# Patient Record
Sex: Female | Born: 1935 | Race: White | Hispanic: No | Smoking: Never smoker
Health system: Southern US, Community
[De-identification: ages and names within clinical notes are randomized; demographics above are authoritative.]

## PROBLEM LIST (undated history)

## (undated) DIAGNOSIS — G4700 Insomnia, unspecified: Secondary | ICD-10-CM

## (undated) DIAGNOSIS — E079 Disorder of thyroid, unspecified: Secondary | ICD-10-CM

## (undated) DIAGNOSIS — IMO0002 Reserved for concepts with insufficient information to code with codable children: Secondary | ICD-10-CM

## (undated) DIAGNOSIS — I482 Chronic atrial fibrillation, unspecified: Secondary | ICD-10-CM

## (undated) DIAGNOSIS — G459 Transient cerebral ischemic attack, unspecified: Secondary | ICD-10-CM

## (undated) DIAGNOSIS — I071 Rheumatic tricuspid insufficiency: Secondary | ICD-10-CM

## (undated) DIAGNOSIS — I340 Nonrheumatic mitral (valve) insufficiency: Secondary | ICD-10-CM

## (undated) DIAGNOSIS — I10 Essential (primary) hypertension: Secondary | ICD-10-CM

## (undated) DIAGNOSIS — G629 Polyneuropathy, unspecified: Secondary | ICD-10-CM

## (undated) DIAGNOSIS — M4800 Spinal stenosis, site unspecified: Secondary | ICD-10-CM

## (undated) DIAGNOSIS — E782 Mixed hyperlipidemia: Secondary | ICD-10-CM

## (undated) DIAGNOSIS — I214 Non-ST elevation (NSTEMI) myocardial infarction: Secondary | ICD-10-CM

## (undated) HISTORY — DX: Polyneuropathy, unspecified: G62.9

## (undated) HISTORY — DX: Reserved for concepts with insufficient information to code with codable children: IMO0002

## (undated) HISTORY — PX: REPLACEMENT TOTAL KNEE BILATERAL: SUR1225

## (undated) HISTORY — DX: Spinal stenosis, site unspecified: M48.00

## (undated) HISTORY — DX: Insomnia, unspecified: G47.00

## (undated) HISTORY — DX: Nonrheumatic mitral (valve) insufficiency: I34.0

## (undated) HISTORY — DX: Essential (primary) hypertension: I10

## (undated) HISTORY — DX: Chronic atrial fibrillation, unspecified: I48.20

## (undated) HISTORY — PX: TONSILLECTOMY: SUR1361

## (undated) HISTORY — DX: Non-ST elevation (NSTEMI) myocardial infarction: I21.4

## (undated) HISTORY — DX: Transient cerebral ischemic attack, unspecified: G45.9

## (undated) HISTORY — PX: DILATION AND CURETTAGE OF UTERUS: SHX78

## (undated) HISTORY — DX: Mixed hyperlipidemia: E78.2

## (undated) HISTORY — DX: Rheumatic tricuspid insufficiency: I07.1

---

## 2006-05-03 ENCOUNTER — Encounter: Payer: Self-pay | Admitting: Cardiology

## 2006-11-29 ENCOUNTER — Encounter: Payer: Self-pay | Admitting: Cardiology

## 2007-12-11 ENCOUNTER — Encounter: Payer: Self-pay | Admitting: Cardiology

## 2009-03-28 ENCOUNTER — Ambulatory Visit: Payer: Self-pay | Admitting: Cardiology

## 2009-03-28 DIAGNOSIS — I4891 Unspecified atrial fibrillation: Secondary | ICD-10-CM | POA: Insufficient documentation

## 2009-03-28 DIAGNOSIS — E785 Hyperlipidemia, unspecified: Secondary | ICD-10-CM

## 2009-03-28 DIAGNOSIS — Z862 Personal history of diseases of the blood and blood-forming organs and certain disorders involving the immune mechanism: Secondary | ICD-10-CM

## 2009-03-28 DIAGNOSIS — I10 Essential (primary) hypertension: Secondary | ICD-10-CM | POA: Insufficient documentation

## 2009-04-09 ENCOUNTER — Encounter: Payer: Self-pay | Admitting: Cardiology

## 2010-03-03 NOTE — Medication Information (Signed)
Summary: RX Folder/ MEDICATION LIST  RX Folder/ MEDICATION LIST   Imported By: Dorise Hiss 04/01/2009 15:23:09  _____________________________________________________________________  External Attachment:    Type:   Image     Comment:   External Document

## 2010-03-03 NOTE — Assessment & Plan Note (Signed)
Summary: NP-WANTING TO MOVE CLOSER TO HOME   Visit Type:  Initial Consult Primary Provider:  Dr. Rosealee Albee MD([place])  CC:  manage a-fib.  History of Present Illness: the patient is in soon-to-be 77 year old female with a history of chronic atrial fibrillation. The patient has declined Coumadin in the past. She is to be followed up for psych hospital. However given her advanced age she does not want to drive out of town anymore. The patient is doing remarkably well for her age. She denies any chest pain shortness of breath orthopnea PND. Her atrial fibrillation is rate controlled. She had an echocardiogram done 2 years ago which were normal LV function. The patient reports no heart failure symptoms. She has occasional palpitations typically in the evening will last a few minutes. She also has history of spinal stenosis with associated neuropathy but no chronic back pain. Due to this condition she often is off-balance.   Preventive Screening-Counseling & Management  Alcohol-Tobacco     Smoking Status: never  Current Medications (verified): 1)  Aspir-Trin 325 Mg Tbec (Aspirin) .... Take 1 Tablet By Mouth Once A Day 2)  Clindamycin Hcl 150 Mg Caps (Clindamycin Hcl) .... As Needed Dental Prophylaxix 3)  Atenolol 25 Mg Tabs (Atenolol) .... Take 1 Tablet By Mouth Two Times A Day 4)  Simvastatin 5 Mg Tabs (Simvastatin) .... Take 1 Tablet By Mouth Once A Day 5)  Levothroid 150 Mcg Tabs (Levothyroxine Sodium) .... Take 1 Tablet By Mouth Once A Day 6)  Lisinopril 5 Mg Tabs (Lisinopril) .... Take 1 Tablet By Mouth Once A Day  Allergies (verified): No Known Drug Allergies  Comments:  Nurse/Medical Assistant: The patient's medications and allergies were reviewed with the patient and were updated in the Medication and Allergy Lists. List reviewed.  Past History:  Family History: Last updated: 03/28/2009 noncontributory  Social History: Last updated: 03/28/2009 Retired  Tobacco Use -  No.   Risk Factors: Smoking Status: never (03/28/2009)  Past Medical History: Atrial Fibrillation Hypertension Hyperlipidemia Spinal stenosis and peripheral neuropathy. Normal LV function. Palpitations  Family History: Reviewed history and no changes required. noncontributory  Social History: Retired  Tobacco Use - No.  Smoking Status:  never  Review of Systems       The patient complains of dizziness.  The patient denies fatigue, malaise, fever, weight gain/loss, vision loss, decreased hearing, hoarseness, chest pain, palpitations, shortness of breath, prolonged cough, wheezing, sleep apnea, coughing up blood, abdominal pain, blood in stool, nausea, vomiting, diarrhea, heartburn, incontinence, blood in urine, muscle weakness, joint pain, leg swelling, rash, skin lesions, headache, fainting, depression, anxiety, enlarged lymph nodes, easy bruising or bleeding, and environmental allergies.    Vital Signs:  Patient profile:   77 year old female Height:      65 inches Weight:      180 pounds Pulse rate:   75 / minute BP sitting:   134 / 83  (left arm) Cuff size:   regular  Vitals Entered By: Carlye Grippe (March 28, 2009 8:24 AM) CC: manage a-fib   Physical Exam  Additional Exam:  General: Well-developed, well-nourished in no distress head: Normocephalic and atraumatic eyes PERRLA/EOMI intact, conjunctiva and lids normal nose: No deformity or lesions mouth normal dentition, normal posterior pharynx neck: Supple, no JVD.  No masses, thyromegaly or abnormal cervical nodes lungs: Normal breath sounds bilaterally without wheezing.  Normal percussion heart:ir regular rate and rhythm with normal S1 and S2, no S3 or S4.  PMI is normal.  No pathological murmurs abdomen: Normal bowel sounds, abdomen is soft and nontender without masses, organomegaly or hernias noted.  No hepatosplenomegaly musculoskeletal: Back normal, normal gait muscle strength and tone normal pulsus:  Pulse is normal in all 4 extremities Extremities: No peripheral pitting edema neurologic: Alert and oriented x 3 skin: Intact without lesions or rashes cervical nodes: No significant adenopathy psychologic: Normal affect    EKG  Procedure date:  03/28/2009  Findings:      atrial fibrillation with low-voltage QRS. Heart rate 88 beats per minutes QTC is 442 ms.  Impression & Recommendations:  Problem # 1:  ATRIAL FIBRILLATION (ICD-427.31) the patient is in chronic atrial fibrillation. Her heart rate is well controlled. There is no need for advancing her medications. We will continue her rate control strategy. As to the issue of Coumadin the patient has declined addressing this is a very appropriate decision at age 39 particularly with her history of spinal stenosis, balance problems and neuropathy. There is no clear benefit versus risk in using Coumadin for prevention of stroke in this situation. Her updated medication list for this problem includes:    Aspir-trin 325 Mg Tbec (Aspirin) .Marland Kitchen... Take 1 tablet by mouth once a day    Atenolol 25 Mg Tabs (Atenolol) .Marland Kitchen... Take 1 tablet by mouth two times a day  Orders: EKG w/ Interpretation (93000)  Problem # 2:  LIPID DISORDER, HX OF (ICD-V12.3) lipid panel is followed by her primary care physician. The patient is on cholesterol-lowering medications.  Problem # 3:  ESSENTIAL HYPERTENSION, BENIGN (ICD-401.1) blood pressure is well controlled. There is no further need of up titration of her medications. Her updated medication list for this problem includes:    Aspir-trin 325 Mg Tbec (Aspirin) .Marland Kitchen... Take 1 tablet by mouth once a day    Atenolol 25 Mg Tabs (Atenolol) .Marland Kitchen... Take 1 tablet by mouth two times a day    Lisinopril 5 Mg Tabs (Lisinopril) .Marland Kitchen... Take 1 tablet by mouth once a day  Patient Instructions: 1)  Take Vitamin D3 800 International Units once daily. 2)  Take Calcium/magnesium combination pill, if willing. 3)  Your physician  wants you to follow-up in: 1 year. You will receive a reminder letter in the mail one-two months in advance. If you don't receive a letter, please call our office to schedule the follow-up appointment.  Appended Document: NP-WANTING TO MOVE CLOSER TO HOME faxed to Dr. Rosealee Albee [phone].

## 2010-03-03 NOTE — Letter (Signed)
Summary: External Correspondence/ OFFICE VISITS [facility]   External Correspondence/ OFFICE VISITS [facility]   Imported By: Dorise Hiss 03/27/2009 16:17:33  _____________________________________________________________________  External Attachment:    Type:   Image     Comment:   External Document

## 2010-04-16 ENCOUNTER — Ambulatory Visit: Payer: Self-pay | Admitting: Cardiology

## 2010-05-05 ENCOUNTER — Encounter: Payer: Self-pay | Admitting: Cardiology

## 2010-05-05 ENCOUNTER — Ambulatory Visit (INDEPENDENT_AMBULATORY_CARE_PROVIDER_SITE_OTHER): Payer: Medicare Other | Admitting: Cardiology

## 2010-05-05 DIAGNOSIS — I4891 Unspecified atrial fibrillation: Secondary | ICD-10-CM

## 2010-05-05 DIAGNOSIS — I10 Essential (primary) hypertension: Secondary | ICD-10-CM

## 2010-05-05 DIAGNOSIS — I472 Ventricular tachycardia: Secondary | ICD-10-CM

## 2010-05-05 DIAGNOSIS — I4821 Permanent atrial fibrillation: Secondary | ICD-10-CM

## 2010-05-05 DIAGNOSIS — E785 Hyperlipidemia, unspecified: Secondary | ICD-10-CM

## 2010-05-05 NOTE — Patient Instructions (Signed)
No medicine changes. Follow up with [facility] in 6 months. You will receive a reminder letter in the mail about 2 months in advance.

## 2010-05-18 NOTE — Assessment & Plan Note (Signed)
Essential hypertension: Lisinopril was discontinued. We rechecked her blood pressure and was within normal limits. We will not restart lisinopril.

## 2010-05-18 NOTE — Assessment & Plan Note (Signed)
Rate controlled continue current medical therapy

## 2010-05-18 NOTE — Assessment & Plan Note (Signed)
Continue medical treatment. Patient's LDL is 43. Just been taken off simvastatin.

## 2010-05-18 NOTE — Progress Notes (Signed)
HPI The patient is a 77 year old female with a history of chronic atrial fibrillation. The patient has declined Coumadin in the past. She was seen a year ago at which time she denied any chest pain or shortness of breath. Her atrial fibrillation was well-controlled. 2 years prior to that she had an echocardiogram done which showed normal LV function. The patient reported no heart failure symptoms. The patient is obese her spinal stenosis associated with neuropathy and chronic back pain The patient still drives her car. I reviewed her laboratory work from her primary care physician. Her LDL is 43 inches been taken off simvastatin which is very appropriate. Her creatinine and hemoglobin are within normal limits. The patient still goes her aerobics class although she has some difficulty with balance. She denies any chest pain shortness of breath orthopnea or PND. She has rare palpitations. The echocardiogram is reviewed and demonstrates atrial fibrillation which is rate controlled but no acute ischemic changes. Her blood pressure was initially elevated during the office visit but on recheck at the time of the park sure her blood pressure was 132/82  No Known Allergies  No current outpatient prescriptions on file prior to visit.    Past Medical History  Diagnosis Date  . Hypertension   . Atrial fibrillation   . Other and unspecified hyperlipidemia   . Nonspecific abnormal results of liver function study   . Palpitations   . Spinal stenosis   . Peripheral neuropathy     Past Surgical History  Procedure Date  . Tonsillectomy   . Dilation and curettage of uterus   . Replacement total knee bilateral     No family history on file.  History   Social History  . Marital Status: Widowed    Spouse Name: N/A    Number of Children: N/A  . Years of Education: N/A   Occupational History  . RETIRED    Social History Main Topics  . Smoking status: Never Smoker   . Smokeless tobacco: Not on  file  . Alcohol Use: No  . Drug Use: No  . Sexually Active: Not on file   Other Topics Concern  . Not on file   Social History Narrative  . No narrative on file   Review of systems:Pertinent positives as outlined above. The remainder of the 18  point review of systems is negative  PHYSICAL EXAM BP 132/82  Pulse 106  Ht 5\' 5"  (1.651 m)  Wt 184 lb (83.462 kg)  BMI 30.62 kg/m2 General: Well-developed, well-nourished in no distress Head: Normocephalic and atraumatic Eyes:PERRLA/EOMI intact, conjunctiva and lids normal Ears: No deformity or lesions Mouth:normal dentition, normal posterior pharynx Neck: Supple, no JVD.  No masses, thyromegaly or abnormal cervical nodes Lungs: Normal breath sounds bilaterally without wheezing.  Normal percussion Cardiac: regular rate and rhythm with normal S1 and S2, no S3 or S4.  PMI is normal.  No pathological murmurs Abdomen: Normal bowel sounds, abdomen is soft and nontender without masses, organomegaly or hernias noted.  No hepatosplenomegaly MSK: Back normal, normal gait muscle strength and tone normal Vascular: Pulse is normal in all 4 extremities Extremities: No peripheral pitting edema Neurologic: Alert and oriented x 3 Skin: Intact without lesions or rashes Lymphatics: No significant adenopathy Psychologic: Normal affect   EAV:[id number] fibrillation  ASSESSMENT AND PLAN

## 2010-11-20 ENCOUNTER — Ambulatory Visit (INDEPENDENT_AMBULATORY_CARE_PROVIDER_SITE_OTHER): Payer: Medicare Other | Admitting: Cardiology

## 2010-11-20 ENCOUNTER — Other Ambulatory Visit: Payer: Self-pay | Admitting: Cardiology

## 2010-11-20 ENCOUNTER — Encounter: Payer: Self-pay | Admitting: Cardiology

## 2010-11-20 VITALS — BP 144/78 | HR 90 | Ht 65.0 in | Wt 183.0 lb

## 2010-11-20 DIAGNOSIS — I4891 Unspecified atrial fibrillation: Secondary | ICD-10-CM

## 2010-11-20 DIAGNOSIS — R220 Localized swelling, mass and lump, head: Secondary | ICD-10-CM

## 2010-11-20 DIAGNOSIS — I10 Essential (primary) hypertension: Secondary | ICD-10-CM

## 2010-11-20 DIAGNOSIS — R221 Localized swelling, mass and lump, neck: Secondary | ICD-10-CM

## 2010-11-20 DIAGNOSIS — R52 Pain, unspecified: Secondary | ICD-10-CM

## 2010-11-20 NOTE — Assessment & Plan Note (Signed)
Blood pressure is well controlled. Continue medical therapy

## 2010-11-20 NOTE — Patient Instructions (Signed)
   CT scan of neck If the results of your test are normal or stable, you will receive a letter.  If they are abnormal, the nurse will contact you by phone. Your physician wants you to follow up in: 6 months.  You will receive a reminder letter in the mail one-two months in advance.  If you don't receive a letter, please call our office to schedule the follow up appointment

## 2010-11-20 NOTE — Assessment & Plan Note (Signed)
The patient has a hard palpable nodule in the right submandibular area and we will order a CT scan of the neck to further evaluate. The nodule does not appear to be located in the thyroid gland.

## 2010-11-20 NOTE — Progress Notes (Signed)
History of present illness:  The patient is a 77 year old female with history of chronic atrial fibrillation, normal LV function, spinal stenosis and chronic back pain. From a cardiac standpoint has been doing well. She reports no chest pain or shortness of breath orthopnea PND. She reports no palpitations or syncope. The patient remains very active, she lives by herself and still drives a car. She did state that she felt some swelling in her right neck area with some tenderness to palpation.   Allergies, family history and social history: As documented in chart and reviewed  Medications: Documented and reviewed in chart.  Past medical history: Reviewed and see problem list below   Review of systems: No nausea or vomiting. No fever or chills. No melena or hematochezia. No orthopnea PND. No palpitations or syncope  Physical examination : Vital signs documented below General: Well-nourished white female walking with a cane. HEENT: EOMI, PERRLA, small nodular palpable mass in the right submandibular area, somewhat tender to touch. No thyromegaly or no masses in the thyroid normal carotid upstroke no carotid bruits Lungs: Clear breath sounds bilaterally no wheezing Heart: Irregular rate and rhythm with normal S1-S2. No pathological murmurs Abdomen: Soft nontender no rebound or guarding good bowel sounds Extremity exam: No cyanosis clubbing or edema. Neurologic: Alert and oriented grossly nonfocal Psychiatric: Normal affect Vascular exam: Normal peripheral pulses bilaterally

## 2010-11-20 NOTE — Assessment & Plan Note (Signed)
Chronic atrial fibrillation with rate control. Patient continues to decline Coumadin and she remains on aspirin. She reports no TIAs or CVA

## 2010-11-24 ENCOUNTER — Other Ambulatory Visit: Payer: Self-pay | Admitting: Cardiology

## 2010-11-24 DIAGNOSIS — E782 Mixed hyperlipidemia: Secondary | ICD-10-CM

## 2010-11-24 DIAGNOSIS — I10 Essential (primary) hypertension: Secondary | ICD-10-CM

## 2010-11-24 DIAGNOSIS — R221 Localized swelling, mass and lump, neck: Secondary | ICD-10-CM

## 2010-11-24 DIAGNOSIS — R220 Localized swelling, mass and lump, head: Secondary | ICD-10-CM

## 2011-03-25 ENCOUNTER — Other Ambulatory Visit: Payer: Self-pay | Admitting: Internal Medicine

## 2011-05-17 ENCOUNTER — Telehealth: Payer: Self-pay | Admitting: *Deleted

## 2011-05-17 NOTE — Telephone Encounter (Signed)
Received call from daughter.  States mother had spell with her atrial fib yesterday morning.  After taking meds, felt weak the rest of the day.  Did have some SOB with episode.  Family tried to get her to go to ED, but patient declined as she was in [place] & did not want to be put in hospital away from home.  Son is on the way back home with her today & wanted her seen by some one when they get back this evening.  Advised daughter that GD is out of the office.  Also, advised her that if her rate is controlled, MD may not add anything new.  We know she has chronic atrial fib & has declined coumadin in the past.  If her rate is uncontrolled with SOB then she should be evaluated in the ED.  The office is not the place to treat this.  She does already have OV scheduled for 4/24 with [facility].  Daughter will advise son of above.

## 2011-05-26 ENCOUNTER — Ambulatory Visit (INDEPENDENT_AMBULATORY_CARE_PROVIDER_SITE_OTHER): Payer: Medicare Other | Admitting: Cardiology

## 2011-05-26 ENCOUNTER — Encounter: Payer: Self-pay | Admitting: Cardiology

## 2011-05-26 VITALS — BP 116/77 | HR 79 | Ht 65.0 in | Wt 182.0 lb

## 2011-05-26 DIAGNOSIS — I4891 Unspecified atrial fibrillation: Secondary | ICD-10-CM

## 2011-05-26 DIAGNOSIS — G4700 Insomnia, unspecified: Secondary | ICD-10-CM

## 2011-05-26 DIAGNOSIS — I482 Chronic atrial fibrillation, unspecified: Secondary | ICD-10-CM | POA: Insufficient documentation

## 2011-05-26 DIAGNOSIS — I10 Essential (primary) hypertension: Secondary | ICD-10-CM

## 2011-05-26 MED ORDER — TRAZODONE HCL 50 MG PO TABS: ORAL_TABLET | ORAL | Status: DC

## 2011-05-26 NOTE — Progress Notes (Signed)
[name], MD, [facility] Board Certified in Adult Cardiovascular Medicine,Internal Medicine and Critical Care Medicine    CC: followup patient with history of chronic atrial fibrillation  HPI:  Patient state that 2 weeks ago when she went to [place] and an episode where she felt particularly beaked and may have had some palpitations.  She felt slightly presyncopal but has no associated shortness of breath or chest pain.  Her family wanted to bring her to the emergency room but the patient had not taken her medications yet.  She states that after she took her atenolol within a few minutes her symptoms resolved.  I presume that she had an episode of tachycardia palpitations.  She reports however no recurrent episodes of presyncope or syncope.  Otherwise cardiovascular perspective she is doing well with no orthopnea or PND. She also reports significant insomnia, she states that she often cannot fall asleep and ruminates and stays up all night.  When this happens she feels very poorly the next morning.  She brought an old prescription with Xanax and asked me for a refill.  I explained to her that this was not a particularly good choice to address the problem of primary insomnia which may or may not be related to some degree of depression.  PMH: reviewed and listed in Problem List in Electronic Records (and see below) Past Medical History  Diagnosis Date  . Hypertension   . Chronic atrial fibrillation     normal LV systolic functionnumber patient declined Coumadin in the past  . Other and unspecified hyperlipidemia   . Nonspecific abnormal results of liver function study   . Palpitations   . Spinal stenosis   . Peripheral neuropathy   . Insomnia    Past Surgical History  Procedure Date  . Tonsillectomy   . Dilation and curettage of uterus   . Replacement total knee bilateral     Allergies/SH/FHX : available in Electronic Records for review  No Known Allergies History   Social  History  . Marital Status: Widowed    Spouse Name: N/A    Number of Children: N/A  . Years of Education: N/A   Occupational History  . RETIRED    Social History Main Topics  . Smoking status: Never Smoker   . Smokeless tobacco: Never Used  . Alcohol Use: No  . Drug Use: No  . Sexually Active: Not on file   Other Topics Concern  . Not on file   Social History Narrative  . No narrative on file   No family history on file.  Medications: Current Outpatient Prescriptions  Medication Sig Dispense Refill  . aspirin 325 MG tablet Take 325 mg by mouth daily.        Marland Kitchen atenolol (TENORMIN) 25 MG tablet Take 25 mg by mouth 2 (two) times daily.       . Calcium Carbonate-Vitamin D (CALTRATE 600+D) 600-400 MG-UNIT per tablet Take 1 tablet by mouth daily.        . clindamycin (CLEOCIN) 150 MG capsule Take 450 mg by mouth as directed.       Marland Kitchen levothyroxine (SYNTHROID, LEVOTHROID) 150 MCG tablet Take 150 mcg by mouth daily.        . traZODone (DESYREL) 50 MG tablet May take 1/2 tab (25mg ) nightly as needed for sleep, may increase to 1 tab (50mg ) if necessary  30 tablet  1    ROS: No nausea or vomiting. No fever or chills.No melena or hematochezia.No bleeding.No  claudication  Physical Exam: BP 116/77  Pulse 79  Ht 5\' 5"  (1.651 m)  Wt 182 lb (82.555 kg)  BMI 30.29 kg/m2  SpO2 96% General:well-nourished white female walking with a cane Neck:normal carotid upstroke and no carotid bruits.  No thyromegaly nonnodular thyroid.  JVP 6 cm Lungs:clear breath sounds bilaterally.  No wheezing Cardiac:irregular and rhythm with normal S1 and S2 and no murmur rubs or gallops Vascular:no edema.  Normal distal pulses Skin:warm and dry Physcologic:normal affect  12lead BJY:NWGNFA fibrillation with rate control Limited bedside ECHO:N/A No images are attached to the encounter.   Assessment and Plan  Chronic atrial fibrillation Patient has declined Coumadin in the past.  She also has difficulty  walking and would be at increased risk for falls.  There has been no history of stroke.  The episode she described probably was related to rapid atrial fibrillation and I told her to take when necessary and extra dose of atenolol if this happens again.  There was no presyncope or syncope associated with this.Will followup patient in 6 months.  Insomnia Patient asked for a refill on Xanax but I told her that given her age this is not a particularly good solution for insomnia.  She states that she has difficulty falling asleep and staying asleep.  Then she becomes anxious in the middle of the night.  I told her that we should address her right ovary insomnia problem first and have given her a prescription of trazodone 25 mg by mouth daily at bedtime, which can be increased to 50 min. By mouth daily at bedtime if needed  ESSENTIAL HYPERTENSION, BENIGN Blood pressure well-controlled no further change in medical therapy    Patient Active Problem List  Diagnoses  . HYPERLIPIDEMIA  . ESSENTIAL HYPERTENSION, BENIGN  . Neck nodule  . Chronic atrial fibrillation  . Insomnia

## 2011-05-26 NOTE — Assessment & Plan Note (Signed)
Blood pressure well-controlled no further change in medical therapy

## 2011-05-26 NOTE — Assessment & Plan Note (Addendum)
Patient has declined Coumadin in the past.  She also has difficulty walking and would be at increased risk for falls.  There has been no history of stroke.  The episode she described probably was related to rapid atrial fibrillation and I told her to take when necessary and extra dose of atenolol if this happens again.  There was no presyncope or syncope associated with this.Will followup patient in 6 months.

## 2011-05-26 NOTE — Patient Instructions (Addendum)
Continue all current medications. Trazodone 50mg  - may take 1/2 tab (25mg ) nightly as needed for sleep, may increase to 1 tab (50mg ) if necessary.  If this medication is helpful, follow up for maintenance with primary MD Your physician wants you to follow up in: 6 months.  You will receive a reminder letter in the mail one-two months in advance.  If you don't receive a letter, please call our office to schedule the follow up appointment

## 2011-05-26 NOTE — Assessment & Plan Note (Signed)
Patient asked for a refill on Xanax but I told her that given her age this is not a particularly good solution for insomnia.  She states that she has difficulty falling asleep and staying asleep.  Then she becomes anxious in the middle of the night.  I told her that we should address her right ovary insomnia problem first and have given her a prescription of trazodone 25 mg by mouth daily at bedtime, which can be increased to 50 min. By mouth daily at bedtime if needed

## 2011-06-03 ENCOUNTER — Other Ambulatory Visit: Payer: Self-pay | Admitting: Emergency Medicine

## 2011-06-04 ENCOUNTER — Other Ambulatory Visit: Payer: Self-pay

## 2011-06-04 DIAGNOSIS — I4891 Unspecified atrial fibrillation: Secondary | ICD-10-CM

## 2011-06-04 DIAGNOSIS — R748 Abnormal levels of other serum enzymes: Secondary | ICD-10-CM

## 2011-06-07 ENCOUNTER — Telehealth: Payer: Self-pay | Admitting: Cardiology

## 2011-06-07 NOTE — Telephone Encounter (Signed)
[name] called the office today in reference to his mother Brandy Webb. States that she was discharged from  [facility] on 06/05/2011. Mr. Flom was concerned about the results of his mother's Echo that was done at the Hospital. Also, does patient need to have an office follow up.

## 2011-06-13 NOTE — Telephone Encounter (Signed)
Needs f/u in 7- 10 days.

## 2011-06-14 NOTE — Telephone Encounter (Signed)
Forwarding to you to schedule follow up as requested below.

## 2011-06-16 ENCOUNTER — Ambulatory Visit (INDEPENDENT_AMBULATORY_CARE_PROVIDER_SITE_OTHER): Payer: Medicare Other | Admitting: Cardiology

## 2011-06-16 ENCOUNTER — Encounter: Payer: Self-pay | Admitting: Cardiology

## 2011-06-16 VITALS — BP 120/76 | HR 94 | Ht 65.0 in | Wt 185.0 lb

## 2011-06-16 DIAGNOSIS — Z9181 History of falling: Secondary | ICD-10-CM

## 2011-06-16 DIAGNOSIS — I059 Rheumatic mitral valve disease, unspecified: Secondary | ICD-10-CM

## 2011-06-16 DIAGNOSIS — G459 Transient cerebral ischemic attack, unspecified: Secondary | ICD-10-CM

## 2011-06-16 DIAGNOSIS — I482 Chronic atrial fibrillation, unspecified: Secondary | ICD-10-CM

## 2011-06-16 DIAGNOSIS — I4891 Unspecified atrial fibrillation: Secondary | ICD-10-CM

## 2011-06-16 DIAGNOSIS — I2789 Other specified pulmonary heart diseases: Secondary | ICD-10-CM

## 2011-06-16 DIAGNOSIS — I079 Rheumatic tricuspid valve disease, unspecified: Secondary | ICD-10-CM

## 2011-06-16 DIAGNOSIS — I272 Pulmonary hypertension, unspecified: Secondary | ICD-10-CM

## 2011-06-16 DIAGNOSIS — I214 Non-ST elevation (NSTEMI) myocardial infarction: Secondary | ICD-10-CM

## 2011-06-16 DIAGNOSIS — I340 Nonrheumatic mitral (valve) insufficiency: Secondary | ICD-10-CM

## 2011-06-16 DIAGNOSIS — G4700 Insomnia, unspecified: Secondary | ICD-10-CM

## 2011-06-16 DIAGNOSIS — I071 Rheumatic tricuspid insufficiency: Secondary | ICD-10-CM

## 2011-06-16 MED ORDER — RIVAROXABAN 15 MG PO TABS: 15.0000 mg | ORAL_TABLET | Freq: Every day | ORAL | Status: DC

## 2011-06-16 NOTE — Patient Instructions (Signed)
   Stop Aspirin  Begin Xarelto 15mg  daily - need to be off the Aspirin x 5 days prior to beginning new medication  No NSAID's - only Tylenol Follow up in  3 months

## 2011-07-10 DIAGNOSIS — Z9181 History of falling: Secondary | ICD-10-CM | POA: Insufficient documentation

## 2011-07-10 DIAGNOSIS — I071 Rheumatic tricuspid insufficiency: Secondary | ICD-10-CM | POA: Insufficient documentation

## 2011-07-10 DIAGNOSIS — I214 Non-ST elevation (NSTEMI) myocardial infarction: Secondary | ICD-10-CM | POA: Insufficient documentation

## 2011-07-10 DIAGNOSIS — I340 Nonrheumatic mitral (valve) insufficiency: Secondary | ICD-10-CM | POA: Insufficient documentation

## 2011-07-10 DIAGNOSIS — I272 Pulmonary hypertension, unspecified: Secondary | ICD-10-CM | POA: Insufficient documentation

## 2011-07-10 DIAGNOSIS — G459 Transient cerebral ischemic attack, unspecified: Secondary | ICD-10-CM | POA: Insufficient documentation

## 2011-07-10 NOTE — Assessment & Plan Note (Signed)
No recurrent chest pain and no prior history of coronary artery disease.  Continue current medical therapy

## 2011-07-10 NOTE — Assessment & Plan Note (Signed)
Patient has permanent atrial fibrillation.  She is at high risk for thromboembolic disease.  She presented recently with confusion and an episode of slurred speech and the possibility of TIA.chest consent to proceed with anticoagulation with Xarelto at 15 mg by mouth daily adjusted for her renal function.I discussed the risks and benefits of anticoagulation with the patient extensively.

## 2011-07-10 NOTE — Assessment & Plan Note (Signed)
Improved with trazodone  

## 2011-07-10 NOTE — Assessment & Plan Note (Signed)
Patient has not been started on anticoagulant therapy.  She will be followed closely.

## 2011-07-10 NOTE — Assessment & Plan Note (Signed)
Patient candidate for medical therapy only.

## 2011-07-10 NOTE — Assessment & Plan Note (Signed)
The patient does report a single episode of fall May 2.  She fell against the basement door.  There was no significant injury.  She will not be more supervised as she is moving into [employer]

## 2011-07-10 NOTE — Assessment & Plan Note (Signed)
Likely secondary to pulmonary venous hypertension and diastolic dysfunction.  Continue medical therapy and monitor oxygenation.  Consider oxygen therapy in the future if needed for desaturation

## 2011-07-10 NOTE — Progress Notes (Signed)
Brandy Bottoms, MD, [facility] Board Certified in Adult Cardiovascular Medicine,Internal Medicine and Critical Care Medicine    CC: followup patient with recent non-ST elevation microinfarction  HPI:  The patient was recently hospitalized with a non-ST elevation myocardial infarction in the setting of urinary tract infection and rapid heart rates.  This was felt to be a type II myocardial infarction.  She was treated medically.  She is felt not to be a candidate for cardiac catheterization and has remained in chronic atrial fibrillation albeit with normal LV function.  The patient has a significant atriopathy with biatrial enlargement and significant .  She also has severe mitral and tricuspid valvular regurgitation.  She has been treated medically.  She has declined Coumadin in the past and had a recent TIA manifested by slurred speech as well as prior episodes of confusion which was felt to be a neurological event.  The patient remains at high risk for trauma embolic disease. She reports shortness of breath on minimal exertion.  Currently she has no orthopnea or PND she does have rare palpitations.  She takes when necessary Tylenol if needed.  She also has a history of insomnia which appeared to be improved.She presents for followup after her recent hospitalization and discussed the need for possible anticoagulation   PMH: reviewed and listed in Problem List in Electronic Records (and see below) Past Medical History  Diagnosis Date  . Hypertension   . Chronic atrial fibrillation     normal LV systolic functionnumber patient declined Coumadin in the past  . Other and unspecified hyperlipidemia   . Nonspecific abnormal results of liver function study   . Palpitations   . Spinal stenosis   . Peripheral neuropathy   . Insomnia    Past Surgical History  Procedure Date  . Tonsillectomy   . Dilation and curettage of uterus   . Replacement total knee bilateral     Allergies/SH/FHX :  available in Electronic Records for review  No Known Allergies History   Social History  . Marital Status: Widowed    Spouse Name: N/A    Number of Children: N/A  . Years of Education: N/A   Occupational History  . RETIRED    Social History Main Topics  . Smoking status: Never Smoker   . Smokeless tobacco: Never Used  . Alcohol Use: No  . Drug Use: No  . Sexually Active: Not on file   Other Topics Concern  . Not on file   Social History Narrative  . No narrative on file   No family history on file.  Medications: Current Outpatient Prescriptions  Medication Sig Dispense Refill  . atenolol (TENORMIN) 25 MG tablet Take 25 mg by mouth 2 (two) times daily.       . Calcium Carbonate-Vitamin D (CALTRATE 600+D) 600-400 MG-UNIT per tablet Take 1 tablet by mouth daily.        . clindamycin (CLEOCIN) 150 MG capsule Take 450 mg by mouth as directed.       Marland Kitchen levothyroxine (SYNTHROID, LEVOTHROID) 150 MCG tablet Take 150 mcg by mouth daily.        . traZODone (DESYREL) 50 MG tablet May take 1/2 tab (25mg ) nightly as needed for sleep, may increase to 1 tab (50mg ) if necessary  30 tablet  1  . Rivaroxaban (XARELTO) 15 MG TABS tablet Take 1 tablet (15 mg total) by mouth daily.  30 tablet  6    ROS: No nausea or vomiting. No fever  or chills.No melena or hematochezia.No bleeding.No claudication  Physical Exam: BP 120/76  Pulse 94  Ht 5\' 5"  (1.651 m)  Wt 185 lb (83.915 kg)  BMI 30.79 kg/m2  SpO2 95% General:well-nourished white female in no distress Neck:normal carotid upstroke and no carotid bruits.  No thyromegaly no nodular thyroid.  JVP is 6-7 cm Lungs:clear breath sounds bilaterally without wheezing Cardiac:irregular rate and rhythm with normal S1 and S2 no definite S3.  2/6 holosystolic murmur at the left lower sternal border and a 3/6 holosystolic murmur at the apex Vascular:no edema.  Normal distal pulses Skin:.warm and dry Physcologic:normal affect  12lead ECG:not  obtained Limited bedside ECHO:N/A No images are attached to the encounter.   Assessment and Plan  Permanent atrial fibrillation Patient has permanent atrial fibrillation.  She is at high risk for thromboembolic disease.  She presented recently with confusion and an episode of slurred speech and the possibility of TIA.chest consent to proceed with anticoagulation with Xarelto at 15 mg by mouth daily adjusted for her renal function.I discussed the risks and benefits of anticoagulation with the patient extensively.  Insomnia Improved with trazodone.  Non-ST elevation myocardial infarction (NSTEMI) No recurrent chest pain and no prior history of coronary artery disease.  Continue current medical therapy  Pulmonary hypertension Likely secondary to pulmonary venous hypertension and diastolic dysfunction.  Continue medical therapy and monitor oxygenation.  Consider oxygen therapy in the future if needed for desaturation  Falls infrequently The patient does report a single episode of fall May 2.  She fell against the basement door.  There was no significant injury.  She will not be more supervised as she is moving into [employer]  TIA (transient ischemic attack) Patient has not been started on anticoagulant therapy.  She will be followed closely.  Mitral regurgitation Patient candidate for medical therapy only.    Patient Active Problem List  Diagnoses  . HYPERLIPIDEMIA  . ESSENTIAL HYPERTENSION, BENIGN  . Neck nodule  . Chronic atrial fibrillation  . Insomnia  . Non-ST elevation myocardial infarction (NSTEMI)  . Pulmonary hypertension  . Falls infrequently  . TIA (transient ischemic attack)  . Tricuspid regurgitation  . Mitral regurgitation

## 2011-09-20 ENCOUNTER — Encounter: Payer: Self-pay | Admitting: Cardiology

## 2011-09-20 ENCOUNTER — Ambulatory Visit (INDEPENDENT_AMBULATORY_CARE_PROVIDER_SITE_OTHER): Payer: Medicare Other | Admitting: Cardiology

## 2011-09-20 VITALS — BP 114/84 | HR 81 | Ht 65.0 in | Wt 186.8 lb

## 2011-09-20 DIAGNOSIS — I214 Non-ST elevation (NSTEMI) myocardial infarction: Secondary | ICD-10-CM

## 2011-09-20 DIAGNOSIS — I071 Rheumatic tricuspid insufficiency: Secondary | ICD-10-CM

## 2011-09-20 DIAGNOSIS — Z9181 History of falling: Secondary | ICD-10-CM

## 2011-09-20 DIAGNOSIS — G4700 Insomnia, unspecified: Secondary | ICD-10-CM

## 2011-09-20 DIAGNOSIS — I10 Essential (primary) hypertension: Secondary | ICD-10-CM

## 2011-09-20 DIAGNOSIS — I340 Nonrheumatic mitral (valve) insufficiency: Secondary | ICD-10-CM

## 2011-09-20 DIAGNOSIS — I059 Rheumatic mitral valve disease, unspecified: Secondary | ICD-10-CM

## 2011-09-20 DIAGNOSIS — I079 Rheumatic tricuspid valve disease, unspecified: Secondary | ICD-10-CM

## 2011-09-20 DIAGNOSIS — I482 Chronic atrial fibrillation, unspecified: Secondary | ICD-10-CM

## 2011-09-20 DIAGNOSIS — I4891 Unspecified atrial fibrillation: Secondary | ICD-10-CM

## 2011-09-20 MED ORDER — ALBUTEROL SULFATE HFA 108 (90 BASE) MCG/ACT IN AERS: 2.0000 | INHALATION_SPRAY | Freq: Four times a day (QID) | RESPIRATORY_TRACT | Status: DC | PRN

## 2011-09-20 NOTE — Patient Instructions (Addendum)
Continue all current medications. Your physician wants you to follow up in: 6 months.  You will receive a reminder letter in the mail one-two months in advance.  If you don't receive a letter, please call our office to schedule the follow up appointment   

## 2011-09-20 NOTE — Assessment & Plan Note (Signed)
Blood pressure well controlled

## 2011-09-20 NOTE — Assessment & Plan Note (Signed)
No chest pain. No ischemia workup needed

## 2011-09-20 NOTE — Assessment & Plan Note (Signed)
Patient takes occasional trazodone which helps

## 2011-09-20 NOTE — Assessment & Plan Note (Signed)
No further therapy indicated patient is hemodynamically stable

## 2011-09-20 NOTE — Assessment & Plan Note (Addendum)
No significant dyspnea or volume overload with rest. Continue current medical regimen. Patient does have severe tricuspid regurgitation but has minimal lower extremity edema which is in part due also to chronic venous insufficiency. Heart rate well controlled

## 2011-09-20 NOTE — Assessment & Plan Note (Signed)
Medical therapy as outlined above.

## 2011-09-20 NOTE — Assessment & Plan Note (Signed)
Patient is doing well on low-dose Xarelto. Although she has some unsteadiness she reports no falls and is being very careful and use a walker if needed.

## 2011-09-20 NOTE — Progress Notes (Signed)
Brandy Bottoms, MD, [facility] Board Certified in Adult Cardiovascular Medicine,Internal Medicine and Critical Care Medicine    CC:      Chronic atrial fibrillation                                                                            HPI:        Patient has history of chronic atrial fibrillation and severe tricuspid regurgitation. However she is hemodynamically stable and reports no significant shortness of breath at rest. She also reports no recurrent palpitations. She is now moving into independent living and is doing quite well. She has been started on Xarelto in the setting of atrial fibrillation and high risk for stroke despite her advanced age she reports no falls and she seemed to be tolerating the medication well. The patient reports no orthopnea PND palpitations or syncope   PMH: reviewed and listed in Problem List in Electronic Records (and see below) Past Medical History  Diagnosis Date  . Hypertension   . Chronic atrial fibrillation     normal LV systolic functionnumber patient declined Coumadin in the past  . Other and unspecified hyperlipidemia   . Nonspecific abnormal results of liver function study   . Palpitations   . Spinal stenosis   . Peripheral neuropathy   . Insomnia    Past Surgical History  Procedure Date  . Tonsillectomy   . Dilation and curettage of uterus   . Replacement total knee bilateral     Allergies/SH/FHX : available in Electronic Records for review  No Known Allergies History   Social History  . Marital Status: Widowed    Spouse Name: N/A    Number of Children: N/A  . Years of Education: N/A   Occupational History  . RETIRED    Social History Main Topics  . Smoking status: Never Smoker   . Smokeless tobacco: Never Used  . Alcohol Use: No  . Drug Use: No  . Sexually Active: Not on file   Other Topics Concern  . Not on file   Social History Narrative  . No narrative on file   No family history on  file.  Medications: Current Outpatient Prescriptions  Medication Sig Dispense Refill  . atenolol (TENORMIN) 25 MG tablet Take 25 mg by mouth 2 (two) times daily.       Marland Kitchen levothyroxine (SYNTHROID, LEVOTHROID) 150 MCG tablet Take 150 mcg by mouth daily.        . Rivaroxaban (XARELTO) 15 MG TABS tablet Take 1 tablet (15 mg total) by mouth daily.  30 tablet  6  . traZODone (DESYREL) 50 MG tablet May take 1/2 tab (25mg ) nightly as needed for sleep, may increase to 1 tab (50mg ) if necessary  30 tablet  1  . albuterol (PROVENTIL HFA;VENTOLIN HFA) 108 (90 BASE) MCG/ACT inhaler Inhale 2 puffs into the lungs every 6 (six) hours as needed for wheezing.  1 Inhaler  2  . clindamycin (CLEOCIN) 150 MG capsule Take 450 mg by mouth as directed.         ROS: No nausea or vomiting. No fever or chills.No melena or hematochezia.No bleeding.No claudication  Physical Exam: BP 114/84  Pulse 81  Ht 5\' 5"  (1.651 m)  Wt 186 lb 12.8 oz (84.732 kg)  BMI 31.09 kg/m2  SpO2 97% General: Well-nourished white female in no distress. Neck: Normal carotid upstroke no carotid bruits. No thyromegaly nonnodular thyroid. JVP 7 cm Lungs: Clear breath sounds bilaterally without any crackles or wheezing Cardiac: Irregular rate and rhythm with normal S1 and prominent S2. 2/6 holosystolic murmur left sternal border Vascular: No edema. Normal distal pulses Skin: Warm and dry. Significant varicosities Physcologic: Normal affect  12lead ECG: Not obtained Limited bedside ECHO:N/A No images are attached to the encounter.   I reviewed and summarized the old records. I reviewed ECG and prior blood work.  Assessment and Plan  Chronic atrial fibrillation No significant dyspnea or volume overload with rest. Continue current medical regimen. Patient does have severe tricuspid regurgitation but has minimal lower extremity edema which is in part due also to chronic venous insufficiency. Heart rate well controlled  ESSENTIAL  HYPERTENSION, BENIGN Blood pressure well controlled  Falls infrequently Patient is doing well on low-dose Xarelto. Although she has some unsteadiness she reports no falls and is being very careful and use a walker if needed.  Insomnia Patient takes occasional trazodone which helps  Mitral regurgitation No further therapy indicated patient is hemodynamically stable  Non-ST elevation myocardial infarction (NSTEMI) No chest pain. No ischemia workup needed  Tricuspid regurgitation Medical therapy as outlined above.    Patient Active Problem List  Diagnosis  . HYPERLIPIDEMIA  . ESSENTIAL HYPERTENSION, BENIGN  . Neck nodule  . Chronic atrial fibrillation  . Insomnia  . Non-ST elevation myocardial infarction (NSTEMI)  . Pulmonary hypertension  . Falls infrequently  . TIA (transient ischemic attack)  . Tricuspid regurgitation

## 2011-09-21 ENCOUNTER — Other Ambulatory Visit: Payer: Self-pay | Admitting: *Deleted

## 2011-11-08 ENCOUNTER — Ambulatory Visit: Payer: Medicare Other | Admitting: Cardiology

## 2012-01-07 ENCOUNTER — Other Ambulatory Visit: Payer: Self-pay | Admitting: Cardiology

## 2012-01-07 ENCOUNTER — Other Ambulatory Visit: Payer: Self-pay | Admitting: *Deleted

## 2012-01-07 ENCOUNTER — Telehealth: Payer: Self-pay | Admitting: Cardiology

## 2012-01-07 MED ORDER — RIVAROXABAN 15 MG PO TABS: 15.0000 mg | ORAL_TABLET | Freq: Every day | ORAL | Status: DC

## 2012-02-01 DIAGNOSIS — I4891 Unspecified atrial fibrillation: Secondary | ICD-10-CM

## 2012-02-08 ENCOUNTER — Encounter: Payer: Self-pay | Admitting: Cardiology

## 2012-02-08 DIAGNOSIS — I340 Nonrheumatic mitral (valve) insufficiency: Secondary | ICD-10-CM | POA: Insufficient documentation

## 2012-02-10 ENCOUNTER — Ambulatory Visit (INDEPENDENT_AMBULATORY_CARE_PROVIDER_SITE_OTHER): Payer: Medicare Other | Admitting: Cardiology

## 2012-02-10 ENCOUNTER — Encounter: Payer: Self-pay | Admitting: Cardiology

## 2012-02-10 VITALS — BP 127/78 | HR 106 | Ht 65.0 in | Wt 181.0 lb

## 2012-02-10 DIAGNOSIS — I059 Rheumatic mitral valve disease, unspecified: Secondary | ICD-10-CM

## 2012-02-10 DIAGNOSIS — I071 Rheumatic tricuspid insufficiency: Secondary | ICD-10-CM

## 2012-02-10 DIAGNOSIS — I10 Essential (primary) hypertension: Secondary | ICD-10-CM

## 2012-02-10 DIAGNOSIS — I482 Chronic atrial fibrillation, unspecified: Secondary | ICD-10-CM

## 2012-02-10 DIAGNOSIS — I340 Nonrheumatic mitral (valve) insufficiency: Secondary | ICD-10-CM

## 2012-02-10 DIAGNOSIS — I4891 Unspecified atrial fibrillation: Secondary | ICD-10-CM

## 2012-02-10 DIAGNOSIS — I079 Rheumatic tricuspid valve disease, unspecified: Secondary | ICD-10-CM

## 2012-02-10 NOTE — Progress Notes (Signed)
Clinical Summary Brandy Webb is a 79 y.o.female presenting for followup. She is a former patient of Dr. Andee Lineman, prefers to stay with [facility]. She was last seen in August 2013.  Recent record review finds hospitalization in late December with progressive cough and evidence of pneumonia. She was treated with antibiotics, also seen by our service in consultation. Atenolol was increased to 50 lm in the morning and 25 mg in the evening for better heart rate control, although she was not discharged home on this regimen. She is here with her son today, states that she is still weak but gradually starting to feel better. She does have home health services.  She reports no bleeding problems on Xarelto. Recent lab work showed BUN 15, creatinine 0.7, potassium 3.9, hemoglobin 12.9.  She has been managed conservatively with significant valvular heart disease. I reviewed this with them today. She does not want any invasive evaluations.  No Known Allergies  Current Outpatient Prescriptions  Medication Sig Dispense Refill  . atenolol (TENORMIN) 25 MG tablet Take 25 mg by mouth 2 (two) times daily.       . cefUROXime (CEFTIN) 500 MG tablet Take 500 mg by mouth 2 (two) times daily.      . clindamycin (CLEOCIN) 150 MG capsule Take 450 mg by mouth as directed.       . doxycycline (VIBRA-TABS) 100 MG tablet Take 100 mg by mouth 2 (two) times daily.      . furosemide (LASIX) 20 MG tablet Take 20 mg by mouth daily.      Marland Kitchen levothyroxine (SYNTHROID, LEVOTHROID) 150 MCG tablet Take 150 mcg by mouth daily.        . Rivaroxaban (XARELTO) 15 MG TABS tablet Take 1 tablet (15 mg total) by mouth daily.  30 tablet  6  . traZODone (DESYREL) 50 MG tablet May take 1/2 tab (25mg ) nightly as needed for sleep, may increase to 1 tab (50mg ) if necessary  30 tablet  1    Past Medical History  Diagnosis Date  . Essential hypertension, benign   . Chronic atrial fibrillation   . Mixed hyperlipidemia   . Spinal stenosis   .  Peripheral neuropathy   . Insomnia   . Mitral regurgitation     Moderate to severe  . Tricuspid regurgitation     Severe  . Secondary pulmonary hypertension     Moderate  . Non-ST elevation MI (NSTEMI)     May 2013 - type 2 event managed medically  . TIA (transient ischemic attack)     Past Surgical History  Procedure Date  . Tonsillectomy   . Dilation and curettage of uterus   . Replacement total knee bilateral     Social History Brandy Webb reports that she has never smoked. She has never used smokeless tobacco. Brandy Webb reports that she does not drink alcohol.  Review of Systems Uses a walker, denies any falls. Has mild ankle edema. Anxious feeling at night time when she lies down. Otherwise negative.  Physical Examination Filed Vitals:   02/10/12 1442  BP: 127/78  Pulse: 106   Filed Weights   02/10/12 1442  Weight: 181 lb (82.101 kg)   No acute distress. HEENT: Conjunctiva and lids normal, oropharynx clear. Neck: Supple, no elevated JVP, no thyromegaly. Lungs: Diminished BS, clear to auscultation, nonlabored breathing at rest. Cardiac: Irregularly irregular, no S3, soft apical systolic murmur, no pericardial rub. Abdomen: Soft, nontender,bowel sounds present, no guarding or rebound. Extremities: Trace edema, distal  pulses 1+. Skin: Warm and dry. Musculoskeletal: Mild  kyphosis. Neuropsychiatric: Alert and oriented x3, affect grossly appropriate.   Problem List and Plan   Chronic atrial fibrillation For now will continue atenolol 25 mg twice daily, although request vital sign checks from home health for further review. May need to advance dose further for heart rate control. She continues on Xarelto, no major bleeding problems, stable renal function.  Mitral regurgitation Moderately severe, being managed conservatively. She is on low-dose Lasix. Daily weights being followed. Suspect some of her sense of anxiety when she lies down at night time may be  related to this.  Tricuspid regurgitation Severe, associated with pulmonary hypertension, being managed conservatively.  ESSENTIAL HYPERTENSION, BENIGN No change to current regimen.    Jonelle Sidle, M.D., F.A.C.C.

## 2012-02-10 NOTE — Assessment & Plan Note (Signed)
For now will continue atenolol 25 mg twice daily, although request vital sign checks from home health for further review. May need to advance dose further for heart rate control. She continues on Xarelto, no major bleeding problems, stable renal function.

## 2012-02-10 NOTE — Patient Instructions (Addendum)
Your physician recommends that you schedule a follow-up appointment in: 1 month. Your physician recommends that you continue on your current medications as directed. Please refer to the Current Medication list given to you today.  Please have your home health agency to fax our office 1 week set of vital signs including weights.

## 2012-02-10 NOTE — Assessment & Plan Note (Signed)
Moderately severe, being managed conservatively. She is on low-dose Lasix. Daily weights being followed. Suspect some of her sense of anxiety when she lies down at night time may be related to this.

## 2012-02-10 NOTE — Assessment & Plan Note (Signed)
Severe, associated with pulmonary hypertension, being managed conservatively.

## 2012-02-10 NOTE — Assessment & Plan Note (Signed)
No change to current regimen. 

## 2012-03-03 ENCOUNTER — Other Ambulatory Visit: Payer: Self-pay | Admitting: *Deleted

## 2012-03-03 MED ORDER — FUROSEMIDE 20 MG PO TABS: 20.0000 mg | ORAL_TABLET | Freq: Every day | ORAL | Status: DC

## 2012-03-17 ENCOUNTER — Ambulatory Visit: Payer: Medicare Other | Admitting: Cardiology

## 2012-04-14 ENCOUNTER — Encounter: Payer: Self-pay | Admitting: Cardiology

## 2012-04-14 ENCOUNTER — Ambulatory Visit (INDEPENDENT_AMBULATORY_CARE_PROVIDER_SITE_OTHER): Payer: Medicare Other | Admitting: Cardiology

## 2012-04-14 VITALS — BP 129/74 | HR 76 | Ht 65.0 in | Wt 185.0 lb

## 2012-04-14 DIAGNOSIS — I10 Essential (primary) hypertension: Secondary | ICD-10-CM

## 2012-04-14 DIAGNOSIS — I4891 Unspecified atrial fibrillation: Secondary | ICD-10-CM

## 2012-04-14 DIAGNOSIS — I482 Chronic atrial fibrillation, unspecified: Secondary | ICD-10-CM

## 2012-04-14 DIAGNOSIS — I059 Rheumatic mitral valve disease, unspecified: Secondary | ICD-10-CM

## 2012-04-14 DIAGNOSIS — I340 Nonrheumatic mitral (valve) insufficiency: Secondary | ICD-10-CM

## 2012-04-14 NOTE — Assessment & Plan Note (Signed)
Heart rate control looks good today. No changes in atenolol dosing. She continues on Xarelto without significant bleeding problems.

## 2012-04-14 NOTE — Progress Notes (Signed)
   Clinical Summary Ms. Venning is a 79 y.o.female last seen in January of this year. She is here with her son. States that she has been doing reasonably well, no major sense of palpitations. No dizziness. She has had no bleeding problems on Xarelto. Heart rate control today looks good.  She resides at [facility]. Reports stable appetite. No significant edema. Had a bout with pneumonia, doing much better at this point. She will be celebrating her 89nd birthday soon.   No Known Allergies  Current Outpatient Prescriptions  Medication Sig Dispense Refill  . ALPRAZolam (XANAX) 0.25 MG tablet Take 0.25 mg by mouth at bedtime as needed.       Marland Kitchen atenolol (TENORMIN) 25 MG tablet Take 25 mg by mouth 2 (two) times daily.       . clindamycin (CLEOCIN) 150 MG capsule Take 450 mg by mouth as directed.       . furosemide (LASIX) 20 MG tablet Take 1 tablet (20 mg total) by mouth daily.  30 tablet  1  . levothyroxine (SYNTHROID, LEVOTHROID) 150 MCG tablet Take 150 mcg by mouth daily.        . Rivaroxaban (XARELTO) 15 MG TABS tablet Take 1 tablet (15 mg total) by mouth daily.  30 tablet  6  . traZODone (DESYREL) 50 MG tablet May take 1/2 tab (25mg ) nightly as needed for sleep, may increase to 1 tab (50mg ) if necessary  30 tablet  1   No current facility-administered medications for this visit.    Past Medical History  Diagnosis Date  . Essential hypertension, benign   . Chronic atrial fibrillation   . Mixed hyperlipidemia   . Spinal stenosis   . Peripheral neuropathy   . Insomnia   . Mitral regurgitation     Moderate to severe  . Tricuspid regurgitation     Severe  . Secondary pulmonary hypertension     Moderate  . Non-ST elevation MI (NSTEMI)     May 2013 - type 2 event managed medically  . TIA (transient ischemic attack)     Social History Ms. Garvey reports that she has never smoked. She has never used smokeless tobacco. Ms. Lanum reports that she does not drink alcohol.  Review of  Systems As outlined above. Otherwise negative.  Physical Examination Filed Vitals:   04/14/12 1515  BP: 129/74  Pulse: 76   Filed Weights   04/14/12 1515  Weight: 185 lb (83.915 kg)    No acute distress.  HEENT: Conjunctiva and lids normal, oropharynx clear.  Neck: Supple, no elevated JVP, no thyromegaly.  Lungs: Diminished BS, clear to auscultation, nonlabored breathing at rest.  Cardiac: Irregularly irregular, no S3, soft apical systolic murmur, no pericardial rub.  Abdomen: Soft, nontender,bowel sounds present, no guarding or rebound.  Extremities: Trace edema, distal pulses 1+.    Problem List and Plan   Chronic atrial fibrillation Heart rate control looks good today. No changes in atenolol dosing. She continues on Xarelto without significant bleeding problems.  Mitral regurgitation Moderately severe, being managed conservatively.   ESSENTIAL HYPERTENSION, BENIGN Blood pressure control is reasonable today. No changes made.    Jonelle Sidle, M.D., F.A.C.C.

## 2012-04-14 NOTE — Assessment & Plan Note (Signed)
Blood pressure control is reasonable today. No changes made. 

## 2012-04-14 NOTE — Assessment & Plan Note (Signed)
Moderately severe, being managed conservatively. 

## 2012-04-14 NOTE — Patient Instructions (Signed)
Continue all current medications. Follow up in  3 months 

## 2012-05-02 ENCOUNTER — Other Ambulatory Visit: Payer: Self-pay | Admitting: Cardiology

## 2012-07-26 ENCOUNTER — Other Ambulatory Visit: Payer: Self-pay | Admitting: Cardiology

## 2012-07-26 MED ORDER — RIVAROXABAN 15 MG PO TABS: 15.0000 mg | ORAL_TABLET | Freq: Every day | ORAL | Status: DC

## 2012-07-28 ENCOUNTER — Ambulatory Visit: Payer: Medicare Other | Admitting: Cardiology

## 2012-08-18 ENCOUNTER — Encounter: Payer: Self-pay | Admitting: Cardiology

## 2012-08-18 ENCOUNTER — Ambulatory Visit (INDEPENDENT_AMBULATORY_CARE_PROVIDER_SITE_OTHER): Payer: Medicare Other | Admitting: Cardiology

## 2012-08-18 VITALS — BP 127/83 | HR 81 | Ht 65.0 in | Wt 190.0 lb

## 2012-08-18 DIAGNOSIS — I482 Chronic atrial fibrillation, unspecified: Secondary | ICD-10-CM

## 2012-08-18 DIAGNOSIS — I059 Rheumatic mitral valve disease, unspecified: Secondary | ICD-10-CM

## 2012-08-18 DIAGNOSIS — I340 Nonrheumatic mitral (valve) insufficiency: Secondary | ICD-10-CM

## 2012-08-18 DIAGNOSIS — I4891 Unspecified atrial fibrillation: Secondary | ICD-10-CM

## 2012-08-18 DIAGNOSIS — I10 Essential (primary) hypertension: Secondary | ICD-10-CM

## 2012-08-18 NOTE — Progress Notes (Signed)
   Clinical Summary Brandy Webb is a 79 y.o.female last seen in March. She is here with her son. She reports no significant change in cardiac status, specifically no progressive palpitations, no chest pain, stable dyspnea on exertion. She is functionally limited by arthritis, continues to use her walker. Reports no recent falls.  ECG today shows atrial fibrillation at 81 beats per minute with aberrantly conducted complex, nonspecific ST changes.  She denies any significant major bleeding episodes on Xarelto, has had some nosebleeds   No Known Allergies  Current Outpatient Prescriptions  Medication Sig Dispense Refill  . ALPRAZolam (XANAX) 0.25 MG tablet Take 0.25 mg by mouth at bedtime as needed.       Marland Kitchen atenolol (TENORMIN) 25 MG tablet Take 25 mg by mouth 2 (two) times daily.       . Calcium Citrate (CITRACAL PO) Take 1 tablet by mouth 2 (two) times daily.      . clindamycin (CLEOCIN) 150 MG capsule Take 450 mg by mouth as directed.       . furosemide (LASIX) 20 MG tablet TAKE ONE TABLET BY MOUTH DAILY  30 tablet  5  . levothyroxine (SYNTHROID, LEVOTHROID) 150 MCG tablet Take 150 mcg by mouth daily.        . Rivaroxaban (XARELTO) 15 MG TABS tablet Take 1 tablet (15 mg total) by mouth daily.  30 tablet  6  . traZODone (DESYREL) 50 MG tablet May take 1/2 tab (25mg ) nightly as needed for sleep, may increase to 1 tab (50mg ) if necessary  30 tablet  1   No current facility-administered medications for this visit.    Past Medical History  Diagnosis Date  . Essential hypertension, benign   . Chronic atrial fibrillation   . Mixed hyperlipidemia   . Spinal stenosis   . Peripheral neuropathy   . Insomnia   . Mitral regurgitation     Moderate to severe  . Tricuspid regurgitation     Severe  . Secondary pulmonary hypertension     Moderate  . Non-ST elevation MI (NSTEMI)     May 2013 - type 2 event managed medically  . TIA (transient ischemic attack)     Social History Brandy Webb  reports that she has never smoked. She has never used smokeless tobacco. Brandy Webb reports that she does not drink alcohol.  Review of Systems Negative except as outlined.  Physical Examination Filed Vitals:   08/18/12 1413  BP: 127/83  Pulse: 81   Filed Weights   08/18/12 1413  Weight: 190 lb (86.183 kg)    Comfortable at rest. HEENT: Conjunctiva and lids normal, oropharynx clear.  Neck: Supple, no elevated JVP, no thyromegaly.  Lungs: Diminished BS, clear to auscultation, nonlabored breathing at rest.  Cardiac: Irregularly irregular, no S3, soft apical systolic murmur, no pericardial rub.  Abdomen: Soft, nontender,bowel sounds present, no guarding or rebound.  Extremities: Trace edema, distal pulses 1+.    Problem List and Plan   Chronic atrial fibrillation Symptomatically stable, ECG reviewed. No change to current regimen.  Essential hypertension, benign Blood pressure well controlled today.  Mitral regurgitation Moderately severe, being managed conservatively.    Jonelle Sidle, M.D., F.A.C.C.

## 2012-08-18 NOTE — Assessment & Plan Note (Signed)
Moderately severe, being managed conservatively. 

## 2012-08-18 NOTE — Assessment & Plan Note (Signed)
Blood pressure well-controlled today. 

## 2012-08-18 NOTE — Assessment & Plan Note (Signed)
Symptomatically stable, ECG reviewed. No change to current regimen.

## 2012-08-18 NOTE — Patient Instructions (Addendum)
Your physician wants you to follow-up in: 6 Months You will receive a reminder letter in the mail two months in advance. If you don't receive a letter, please call our office to schedule the follow-up appointment.  Your physician recommends that you continue on your current medications as directed. Please refer to the Current Medication list given to you today.   

## 2012-10-20 ENCOUNTER — Other Ambulatory Visit: Payer: Self-pay | Admitting: Cardiology

## 2012-11-14 ENCOUNTER — Encounter: Payer: Self-pay | Admitting: Cardiology

## 2012-11-14 ENCOUNTER — Ambulatory Visit (INDEPENDENT_AMBULATORY_CARE_PROVIDER_SITE_OTHER): Payer: Medicare Other | Admitting: Cardiology

## 2012-11-14 VITALS — BP 130/75 | HR 97 | Ht 65.0 in | Wt 173.0 lb

## 2012-11-14 DIAGNOSIS — I10 Essential (primary) hypertension: Secondary | ICD-10-CM

## 2012-11-14 DIAGNOSIS — I340 Nonrheumatic mitral (valve) insufficiency: Secondary | ICD-10-CM

## 2012-11-14 DIAGNOSIS — I4891 Unspecified atrial fibrillation: Secondary | ICD-10-CM

## 2012-11-14 DIAGNOSIS — I059 Rheumatic mitral valve disease, unspecified: Secondary | ICD-10-CM

## 2012-11-14 DIAGNOSIS — I482 Chronic atrial fibrillation, unspecified: Secondary | ICD-10-CM

## 2012-11-14 MED ORDER — APIXABAN 2.5 MG PO TABS: 2.5000 mg | ORAL_TABLET | Freq: Two times a day (BID) | ORAL | Status: DC

## 2012-11-14 NOTE — Assessment & Plan Note (Signed)
After detailed discussion outlined above, we will try Eliquis 2.5 mg twice daily instead of Xarelto. Hopefully she will fare better with this in terms of nosebleeds or other bleeding problems. Otherwise we will stop further efforts at anticoagulation. She might consider resuming a coated aspirin at that point as she had taken previously, realizing that the stroke risk reduction is much less favorable. Followup in one month.

## 2012-11-14 NOTE — Patient Instructions (Addendum)
Your physician recommends that you schedule a follow-up appointment in: MONTH WITH Dr. Diona Browner   Your physician has recommended you make the following change in your medication:   1) STOP TAKING THE XARELTO 2) START TAKING ELIQUIS 2.5MG  TWICE DAILY  3) IF YOU NOTICE ANY NOSE BLEEDS PLEASE CALL OUR OFFICE AT [phone]

## 2012-11-14 NOTE — Assessment & Plan Note (Signed)
No change to current regimen. 

## 2012-11-14 NOTE — Progress Notes (Signed)
Clinical SummaryShe Brandy Webb is a 79 y.o.female last seen in July. Interval history reviewed. She was seen at [facility] recently with epistaxis and Xarelto was stopped - she was seen by the Hospitalist service. She has been having problems with relatively frequent nosebleeds, is very worried now about going back on Xarelto. She is here with her son today. We discussed in detail the risks and benefits of anticoagulant treatments including the novel agents as it relates to stroke risk reduction and also risk of bleeding. CHADS2 score is 4.  Labwork reviewed finding Hgb 10.8 platelets 248, creatinine 0.8, GFR > 60. She has not had any nosebleeds since hospital discharge. Reports no obvious melena or hematochezia. No falls.   No Known Allergies  Current Outpatient Prescriptions  Medication Sig Dispense Refill  . ALPRAZolam (XANAX) 0.25 MG tablet Take 0.25 mg by mouth at bedtime as needed.       Marland Kitchen atenolol (TENORMIN) 25 MG tablet Take 25 mg by mouth 2 (two) times daily.       . Calcium Citrate (CITRACAL PO) Take 1 tablet by mouth 2 (two) times daily.      . clindamycin (CLEOCIN) 150 MG capsule Take 450 mg by mouth as directed.       . furosemide (LASIX) 20 MG tablet TAKE ONE TABLET BY MOUTH DAILY  30 tablet  6  . HYDROcodone-acetaminophen (NORCO/VICODIN) 5-325 MG per tablet Take 1 tablet by mouth every 6 (six) hours as needed.       Marland Kitchen levothyroxine (SYNTHROID, LEVOTHROID) 150 MCG tablet Take 150 mcg by mouth daily.        . traZODone (DESYREL) 50 MG tablet May take 1/2 tab (25mg ) nightly as needed for sleep, may increase to 1 tab (50mg ) if necessary  30 tablet  1  . apixaban (ELIQUIS) 2.5 MG TABS tablet Take 1 tablet (2.5 mg total) by mouth 2 (two) times daily.  60 tablet  3  . Rivaroxaban (XARELTO) 15 MG TABS tablet Take 1 tablet (15 mg total) by mouth daily.  30 tablet  6  . traMADol (ULTRAM) 50 MG tablet Take 50 mg by mouth every 6 (six) hours as needed.        No current  facility-administered medications for this visit.    Past Medical History  Diagnosis Date  . Essential hypertension, benign   . Chronic atrial fibrillation   . Mixed hyperlipidemia   . Spinal stenosis   . Peripheral neuropathy   . Insomnia   . Mitral regurgitation     Moderate to severe  . Tricuspid regurgitation     Severe  . Secondary pulmonary hypertension     Moderate  . Non-ST elevation MI (NSTEMI)     May 2013 - type 2 event managed medically  . TIA (transient ischemic attack)     Social History Brandy Webb reports that she has never smoked. She has never used smokeless tobacco. Brandy Webb reports that she does not drink alcohol.  Review of Systems Arthritic pain. Otherwise negative.  Physical Examination Filed Vitals:   11/14/12 1253  BP: 130/75  Pulse: 97   Filed Weights   11/14/12 1253  Weight: 173 lb (78.472 kg)    Comfortable at rest.  HEENT: Conjunctiva and lids normal, oropharynx clear.  Neck: Supple, no elevated JVP, no thyromegaly.  Lungs: Diminished BS, clear to auscultation, nonlabored breathing at rest.  Cardiac: Irregularly irregular, no S3, soft apical systolic murmur, no pericardial rub.  Abdomen: Soft, nontender,bowel sounds  present, no guarding or rebound.  Extremities: Trace edema, distal pulses 1+.  Skin: Warm and dry. Musculoskeletal: No kyphosis. Neuropsychiatric: Alert and oriented x3, affect appropriate.   Problem List and Plan   Chronic atrial fibrillation After detailed discussion outlined above, we will try Eliquis 2.5 mg twice daily instead of Xarelto. Hopefully she will fare better with this in terms of nosebleeds or other bleeding problems. Otherwise we will stop further efforts at anticoagulation. She might consider resuming a coated aspirin at that point as she had taken previously, realizing that the stroke risk reduction is much less favorable. Followup in one month.  Essential hypertension, benign No change to current  regimen.  Mitral regurgitation Moderately severe, being managed conservatively.    Jonelle Sidle, M.D., F.A.C.C.

## 2012-11-14 NOTE — Assessment & Plan Note (Signed)
Moderately severe, being managed conservatively. 

## 2012-12-15 ENCOUNTER — Ambulatory Visit (INDEPENDENT_AMBULATORY_CARE_PROVIDER_SITE_OTHER): Payer: Medicare Other | Admitting: Cardiology

## 2012-12-15 ENCOUNTER — Encounter: Payer: Self-pay | Admitting: Cardiology

## 2012-12-15 VITALS — BP 127/87 | HR 97 | Ht 66.0 in | Wt 179.0 lb

## 2012-12-15 DIAGNOSIS — I059 Rheumatic mitral valve disease, unspecified: Secondary | ICD-10-CM

## 2012-12-15 DIAGNOSIS — I482 Chronic atrial fibrillation, unspecified: Secondary | ICD-10-CM

## 2012-12-15 DIAGNOSIS — I340 Nonrheumatic mitral (valve) insufficiency: Secondary | ICD-10-CM

## 2012-12-15 DIAGNOSIS — I4891 Unspecified atrial fibrillation: Secondary | ICD-10-CM

## 2012-12-15 NOTE — Assessment & Plan Note (Signed)
At this point we will continue Eliquis and Tenormin. Followup CBC and BMET in 3 months.

## 2012-12-15 NOTE — Patient Instructions (Addendum)
Your physician recommends that you schedule a follow-up appointment in: 3 months with Dr. McDowell.  Your physician recommends that you continue on your current medications as directed. Please refer to the Current Medication list given to you today.   

## 2012-12-15 NOTE — Progress Notes (Signed)
    Clinical Summary Ms. Macaluso is a 79 y.o.female seen recently in October. Medications were adjusted at that time including a switch to Eliquis for anticoagulation. She had been having nosebleeds on Xarelto previously. She is not on any concurrent antiplatelet agents.  She is here with her son. States that she has done reasonably well, no recurrent nosebleeds. Seems to be tolerating Eliquis. Has had limited appetite chronically, NYHA class III dyspnea. No progressive edema, no hospitalizations. Plan is to continue medical therapy and observation.   No Known Allergies  Current Outpatient Prescriptions  Medication Sig Dispense Refill  . ALPRAZolam (XANAX) 0.25 MG tablet Take 0.25 mg by mouth at bedtime as needed.       Marland Kitchen apixaban (ELIQUIS) 2.5 MG TABS tablet Take 1 tablet (2.5 mg total) by mouth 2 (two) times daily.  60 tablet  3  . atenolol (TENORMIN) 25 MG tablet Take 25 mg by mouth 2 (two) times daily.       . Calcium Citrate (CITRACAL PO) Take 1 tablet by mouth 2 (two) times daily.      . furosemide (LASIX) 20 MG tablet TAKE ONE TABLET BY MOUTH DAILY  30 tablet  6  . HYDROcodone-acetaminophen (NORCO/VICODIN) 5-325 MG per tablet Take 1 tablet by mouth every 6 (six) hours as needed.       Marland Kitchen levothyroxine (SYNTHROID, LEVOTHROID) 150 MCG tablet Take 150 mcg by mouth daily.        . traMADol (ULTRAM) 50 MG tablet Take 50 mg by mouth every 6 (six) hours as needed.       . traZODone (DESYREL) 50 MG tablet May take 1/2 tab (25mg ) nightly as needed for sleep, may increase to 1 tab (50mg ) if necessary  30 tablet  1  . clindamycin (CLEOCIN) 150 MG capsule Take 450 mg by mouth as directed.        No current facility-administered medications for this visit.    Past Medical History  Diagnosis Date  . Essential hypertension, benign   . Chronic atrial fibrillation   . Mixed hyperlipidemia   . Spinal stenosis   . Peripheral neuropathy   . Insomnia   . Mitral regurgitation     Moderate to  severe  . Tricuspid regurgitation     Severe  . Secondary pulmonary hypertension     Moderate  . Non-ST elevation MI (NSTEMI)     May 2013 - type 2 event managed medically  . TIA (transient ischemic attack)     Social History Ms. Vanwagoner reports that she has never smoked. She has never used smokeless tobacco. Ms. Rann reports that she does not drink alcohol.  Review of Systems As outlined above.  Physical Examination Filed Vitals:   12/15/12 1511  BP: 127/87  Pulse: 97   Filed Weights   12/15/12 1511  Weight: 179 lb (81.194 kg)    Comfortable at rest.  HEENT: Conjunctiva and lids normal, oropharynx clear.  Neck: Supple, no elevated JVP, no thyromegaly.  Lungs: Diminished BS, clear to auscultation, nonlabored breathing at rest.  Cardiac: Irregularly irregular, no S3, soft apical systolic murmur, no pericardial rub.  Abdomen: Soft, nontender,bowel sounds present, no guarding or rebound.  Extremities: Trace edema, distal pulses 1+.    Problem List and Plan   Chronic atrial fibrillation At this point we will continue Eliquis and Tenormin. Followup CBC and BMET in 3 months.  Mitral regurgitation Moderately severe, being managed conservatively.    Jonelle Sidle, M.D., F.A.C.C.

## 2012-12-15 NOTE — Assessment & Plan Note (Signed)
Moderately severe, being managed conservatively. 

## 2013-02-23 ENCOUNTER — Telehealth: Payer: Self-pay | Admitting: *Deleted

## 2013-02-23 NOTE — Telephone Encounter (Signed)
Pt was brushing teeth last night and gums started bleeding, they are still bleeding this morning. She is on eliquis.

## 2013-02-23 NOTE — Telephone Encounter (Signed)
Attempted to reach pt regarding bleeding gums.has been on eliquis since last year.Phone busy will try again

## 2013-02-23 NOTE — Telephone Encounter (Signed)
Pt went to dentist yesterday and had teeth cleaned,had some glum bleeding then.today after brushing teeth with hard bristle brush noted some bleeding to day .Encouraged patient use salt water gargle and switch to soft bristle brush.She said dentist gave her one yesterday.I asked to call back if bleeding persisted  Patient agreed with disposition

## 2013-03-09 ENCOUNTER — Encounter: Payer: Self-pay | Admitting: Cardiology

## 2013-03-09 ENCOUNTER — Ambulatory Visit (INDEPENDENT_AMBULATORY_CARE_PROVIDER_SITE_OTHER): Payer: Medicare Other | Admitting: Cardiology

## 2013-03-09 VITALS — BP 135/80 | HR 85 | Ht 65.0 in | Wt 176.0 lb

## 2013-03-09 DIAGNOSIS — I059 Rheumatic mitral valve disease, unspecified: Secondary | ICD-10-CM

## 2013-03-09 DIAGNOSIS — I340 Nonrheumatic mitral (valve) insufficiency: Secondary | ICD-10-CM

## 2013-03-09 DIAGNOSIS — I4891 Unspecified atrial fibrillation: Secondary | ICD-10-CM

## 2013-03-09 DIAGNOSIS — I482 Chronic atrial fibrillation, unspecified: Secondary | ICD-10-CM

## 2013-03-09 DIAGNOSIS — I10 Essential (primary) hypertension: Secondary | ICD-10-CM

## 2013-03-09 NOTE — Assessment & Plan Note (Signed)
No change in current regimen. 

## 2013-03-09 NOTE — Patient Instructions (Signed)
Your physician recommends that you schedule a follow-up appointment in: 3 months   Your physician recommends that you continue on your current medications as directed. Please refer to the Current Medication list given to you today.       Thanks for choosing [facility] !  

## 2013-03-09 NOTE — Progress Notes (Signed)
Clinical Summary Ms. Randa Evensdwards is a 80 y.o.female last seen in November 2014. She is here with her son. States that she feels the best she has felt in quite a while. No palpitations or chest pain. Did have some gum bleeding after having her teeth cleaned and brushing her teeth vigorously. This resolved by swishing with salt water. She has otherwise tolerated Eliquis without spontaneous bleeding.  She is due for followup BMET and CBC. She will be seeing her primary care physician in March for a full panel of labs.   No Known Allergies  Current Outpatient Prescriptions  Medication Sig Dispense Refill  . ALPRAZolam (XANAX) 0.25 MG tablet Take 0.25 mg by mouth at bedtime as needed.       Marland Kitchen. apixaban (ELIQUIS) 2.5 MG TABS tablet Take 1 tablet (2.5 mg total) by mouth 2 (two) times daily.  60 tablet  3  . atenolol (TENORMIN) 25 MG tablet Take 25 mg by mouth 2 (two) times daily.       . Calcium Citrate (CITRACAL PO) Take 1 tablet by mouth 2 (two) times daily.      . clindamycin (CLEOCIN) 150 MG capsule Take 450 mg by mouth as directed.       . furosemide (LASIX) 20 MG tablet TAKE ONE TABLET BY MOUTH DAILY  30 tablet  6  . levothyroxine (SYNTHROID, LEVOTHROID) 150 MCG tablet Take 150 mcg by mouth daily.        . traMADol (ULTRAM) 50 MG tablet Take 50 mg by mouth every 6 (six) hours as needed.       . traZODone (DESYREL) 50 MG tablet May take 1/2 tab (25mg ) nightly as needed for sleep, may increase to 1 tab (50mg ) if necessary  30 tablet  1   No current facility-administered medications for this visit.    Past Medical History  Diagnosis Date  . Essential hypertension, benign   . Chronic atrial fibrillation   . Mixed hyperlipidemia   . Spinal stenosis   . Peripheral neuropathy   . Insomnia   . Mitral regurgitation     Moderate to severe  . Tricuspid regurgitation     Severe  . Secondary pulmonary hypertension     Moderate  . Non-ST elevation MI (NSTEMI)     May 2013 - type 2 event  managed medically  . TIA (transient ischemic attack)     Social History Ms. Lejeune reports that she has never smoked. She has never used smokeless tobacco. Ms. Randa Evensdwards reports that she does not drink alcohol.  Review of Systems No falls. Arthritic pains. Uses a rolling walker. Stable appetite but has trouble tasting sweets. Otherwise negative.  Physical Examination Filed Vitals:   03/09/13 1620  BP: 135/80  Pulse: 85   Filed Weights   03/09/13 1620  Weight: 176 lb (79.833 kg)    Comfortable at rest.  HEENT: Conjunctiva and lids normal, oropharynx clear.  Neck: Supple, no elevated JVP, no thyromegaly.  Lungs: Diminished BS, clear to auscultation, nonlabored breathing at rest.  Cardiac: Irregularly irregular, no S3, soft apical systolic murmur, no pericardial rub.  Abdomen: Soft, nontender,bowel sounds present, no guarding or rebound.  Extremities: Trace edema, distal pulses 1+.    Problem List and Plan   Chronic atrial fibrillation Continue strategy of heart rate control and anticoagulation. She continues on atenolol and Eliquis. Followup arranged.  Essential hypertension, benign No change in current regimen.  Mitral regurgitation Moderately severe, being managed conservatively.    Illene BolusSamuel G.  Domenic Polite, M.D., F.A.C.C.

## 2013-03-09 NOTE — Assessment & Plan Note (Signed)
Continue strategy of heart rate control and anticoagulation. She continues on atenolol and Eliquis. Followup arranged. 

## 2013-03-09 NOTE — Assessment & Plan Note (Signed)
Moderately severe, being managed conservatively. 

## 2013-03-14 ENCOUNTER — Other Ambulatory Visit: Payer: Self-pay | Admitting: Cardiology

## 2013-03-30 ENCOUNTER — Ambulatory Visit: Payer: Medicare Other | Admitting: Cardiology

## 2013-05-18 ENCOUNTER — Other Ambulatory Visit: Payer: Self-pay | Admitting: *Deleted

## 2013-05-18 MED ORDER — FUROSEMIDE 20 MG PO TABS: 20.0000 mg | ORAL_TABLET | Freq: Every day | ORAL | Status: DC

## 2013-06-01 ENCOUNTER — Encounter: Payer: Self-pay | Admitting: Cardiology

## 2013-06-01 ENCOUNTER — Ambulatory Visit (INDEPENDENT_AMBULATORY_CARE_PROVIDER_SITE_OTHER): Payer: Medicare Other | Admitting: Cardiology

## 2013-06-01 VITALS — BP 124/77 | HR 99 | Ht 65.5 in | Wt 173.0 lb

## 2013-06-01 DIAGNOSIS — I4891 Unspecified atrial fibrillation: Secondary | ICD-10-CM

## 2013-06-01 DIAGNOSIS — I059 Rheumatic mitral valve disease, unspecified: Secondary | ICD-10-CM

## 2013-06-01 DIAGNOSIS — I340 Nonrheumatic mitral (valve) insufficiency: Secondary | ICD-10-CM

## 2013-06-01 DIAGNOSIS — I10 Essential (primary) hypertension: Secondary | ICD-10-CM

## 2013-06-01 DIAGNOSIS — I482 Chronic atrial fibrillation, unspecified: Secondary | ICD-10-CM

## 2013-06-01 NOTE — Assessment & Plan Note (Signed)
Continue strategy of heart rate control and anticoagulation. She continues on atenolol and Eliquis. Followup arranged.

## 2013-06-01 NOTE — Patient Instructions (Signed)
Continue all current medications. Your physician wants you to follow up in: 6 months.  You will receive a reminder letter in the mail one-two months in advance.  If you don't receive a letter, please call our office to schedule the follow up appointment   

## 2013-06-01 NOTE — Assessment & Plan Note (Signed)
Moderately severe, being managed conservatively. 

## 2013-06-01 NOTE — Assessment & Plan Note (Signed)
Blood pressure is normal today. 

## 2013-06-01 NOTE — Progress Notes (Signed)
    Clinical Summary [name] is a 80 y.o.female last seen in November of this year. She is here with her son. States that she continues to do very well, no major change in health or hospitalizations since I last saw her. She will be traveling with family to [place] around Mother's Day.  She reports no significant bleeding problems on Eliquis, has had followup lab work with her primary care provider.  Denies any falls, uses a walker.   No Known Allergies  Current Outpatient Prescriptions  Medication Sig Dispense Refill  . ALPRAZolam (XANAX) 0.25 MG tablet Take 0.25 mg by mouth at bedtime as needed.       Marland Kitchen. atenolol (TENORMIN) 25 MG tablet Take 25 mg by mouth 2 (two) times daily.       . Calcium Citrate (CITRACAL PO) Take 1 tablet by mouth 2 (two) times daily.      . clindamycin (CLEOCIN) 150 MG capsule Take 450 mg by mouth as directed.       Marland Kitchen. ELIQUIS 2.5 MG TABS tablet TAKE ONE TABLET BY MOUTH TWICE DAILY  60 tablet  6  . furosemide (LASIX) 20 MG tablet Take 1 tablet (20 mg total) by mouth daily.  30 tablet  6  . levothyroxine (SYNTHROID, LEVOTHROID) 150 MCG tablet Take 150 mcg by mouth daily.        . traMADol (ULTRAM) 50 MG tablet Take 50 mg by mouth every 6 (six) hours as needed.       . traZODone (DESYREL) 50 MG tablet May take 1/2 tab (25mg ) nightly as needed for sleep, may increase to 1 tab (50mg ) if necessary  30 tablet  1   No current facility-administered medications for this visit.    Past Medical History  Diagnosis Date  . Essential hypertension, benign   . Chronic atrial fibrillation   . Mixed hyperlipidemia   . Spinal stenosis   . Peripheral neuropathy   . Insomnia   . Mitral regurgitation     Moderate to severe  . Tricuspid regurgitation     Severe  . Secondary pulmonary hypertension     Moderate  . Non-ST elevation MI (NSTEMI)     May 2013 - type 2 event managed medically  . TIA (transient ischemic attack)     Social History Ms. Romain reports that  she has never smoked. She has never used smokeless tobacco. [name] reports that she does not drink alcohol.  Review of Systems Stable appetite, no chest pain. Otherwise as outlined.  Physical Examination Filed Vitals:   06/01/13 1613  BP: 124/77  Pulse: 99   Filed Weights   06/01/13 1613  Weight: 173 lb (78.472 kg)    Comfortable at rest.  HEENT: Conjunctiva and lids normal, oropharynx clear.  Neck: Supple, no elevated JVP, no thyromegaly.  Lungs: Diminished BS, clear to auscultation, nonlabored breathing at rest.  Cardiac: Irregularly irregular, no S3, soft apical systolic murmur, no pericardial rub.  Abdomen: Soft, nontender,bowel sounds present, no guarding or rebound.  Extremities: Trace edema, distal pulses 1+.    Problem List and Plan   Chronic atrial fibrillation Continue strategy of heart rate control and anticoagulation. She continues on atenolol and Eliquis. Followup arranged.  Essential hypertension, benign Blood pressure is normal today.  Mitral regurgitation Moderately severe, being managed conservatively.    Jonelle SidleSamuel G. McDowell, M.D., F.A.C.C.

## 2013-07-19 ENCOUNTER — Other Ambulatory Visit: Payer: Self-pay | Admitting: *Deleted

## 2013-07-19 MED ORDER — ATENOLOL 25 MG PO TABS: 25.0000 mg | ORAL_TABLET | Freq: Two times a day (BID) | ORAL | Status: DC

## 2013-07-28 ENCOUNTER — Other Ambulatory Visit: Payer: Self-pay | Admitting: Cardiology

## 2013-09-03 ENCOUNTER — Telehealth: Payer: Self-pay | Admitting: Cardiology

## 2013-09-03 MED ORDER — ATENOLOL 25 MG PO TABS: 25.0000 mg | ORAL_TABLET | Freq: Two times a day (BID) | ORAL | Status: DC

## 2013-09-03 NOTE — Telephone Encounter (Signed)
atenolol (TENORMIN) 25 MG tablet   Patient accidentally thru away her prescription and has 4 pills left and needs another refill called into [facility]

## 2013-10-18 ENCOUNTER — Other Ambulatory Visit: Payer: Self-pay | Admitting: Cardiology

## 2013-11-12 ENCOUNTER — Ambulatory Visit: Payer: Medicare Other | Admitting: Cardiology

## 2013-11-12 ENCOUNTER — Ambulatory Visit (INDEPENDENT_AMBULATORY_CARE_PROVIDER_SITE_OTHER): Payer: Medicare Other | Admitting: Cardiology

## 2013-11-12 ENCOUNTER — Encounter: Payer: Self-pay | Admitting: Cardiology

## 2013-11-12 VITALS — BP 126/75 | HR 83 | Ht 66.0 in | Wt 176.1 lb

## 2013-11-12 DIAGNOSIS — I482 Chronic atrial fibrillation, unspecified: Secondary | ICD-10-CM

## 2013-11-12 DIAGNOSIS — I340 Nonrheumatic mitral (valve) insufficiency: Secondary | ICD-10-CM

## 2013-11-12 NOTE — Progress Notes (Signed)
    Clinical Summary [name] is a 80 y.o.female last seen in May. She is here with her son today. Reports no palpitations, no bleeding episodes. She has been having trouble with arthritic pain.  Reviewed her medications. Last lab work was done by her primary care provider back in March of this year - hemoglobin 12.5, BUN 16, creatinine 0.8, potassium 4.3.  ECG today shows rate-controlled atrial fibrillation.   No Known Allergies  Current Outpatient Prescriptions  Medication Sig Dispense Refill  . ALPRAZolam (XANAX) 0.25 MG tablet Take 0.25 mg by mouth at bedtime as needed.       Marland Kitchen. atenolol (TENORMIN) 25 MG tablet Take 1 tablet (25 mg total) by mouth 2 (two) times daily.  60 tablet  6  . clindamycin (CLEOCIN) 150 MG capsule Take 450 mg by mouth as directed.       Marland Kitchen. ELIQUIS 2.5 MG TABS tablet TAKE ONE TABLET BY MOUTH TWICE DAILY  60 tablet  3  . furosemide (LASIX) 20 MG tablet Take 1 tablet (20 mg total) by mouth daily.  30 tablet  6  . levothyroxine (SYNTHROID, LEVOTHROID) 150 MCG tablet Take 150 mcg by mouth daily.        . traMADol (ULTRAM) 50 MG tablet Take 50 mg by mouth every 6 (six) hours as needed.       . traZODone (DESYREL) 50 MG tablet May take 1/2 tab (25mg ) nightly as needed for sleep, may increase to 1 tab (50mg ) if necessary  30 tablet  1   No current facility-administered medications for this visit.    Past Medical History  Diagnosis Date  . Essential hypertension, benign   . Chronic atrial fibrillation   . Mixed hyperlipidemia   . Spinal stenosis   . Peripheral neuropathy   . Insomnia   . Mitral regurgitation     Moderate to severe  . Tricuspid regurgitation     Severe  . Secondary pulmonary hypertension     Moderate  . Non-ST elevation MI (NSTEMI)     May 2013 - type 2 event managed medically  . TIA (transient ischemic attack)     Social History [name] reports that she has never smoked. She has never used smokeless tobacco. [name] reports  that she does not drink alcohol.  Review of Systems Appetite fair, has not lost weight. Other systems reviewed and negative.  Physical Examination Filed Vitals:   11/12/13 1004  BP: 126/75  Pulse: 83   Filed Weights   11/12/13 1004  Weight: 176 lb 1.9 oz (79.888 kg)    Comfortable at rest.  HEENT: Conjunctiva and lids normal, oropharynx clear.  Neck: Supple, no elevated JVP, no thyromegaly.  Lungs: Diminished BS, clear to auscultation, nonlabored breathing at rest.  Cardiac: Irregularly irregular, no S3, soft apical systolic murmur, no pericardial rub.  Abdomen: Soft, nontender,bowel sounds present, no guarding or rebound.  Extremities: Trace edema, distal pulses    Problem List and Plan   Chronic atrial fibrillation Continue heart rate control with atenolol, and anticoagulation with low dose Eliquis. Repeat CBC and BMET.  Mitral regurgitation Moderately severe, being managed conservatively.    Jonelle SidleSamuel G. Kinzy Weyers, M.D., F.A.C.C.

## 2013-11-12 NOTE — Assessment & Plan Note (Signed)
Continue heart rate control with atenolol, and anticoagulation with low dose Eliquis. Repeat CBC and BMET.

## 2013-11-12 NOTE — Assessment & Plan Note (Signed)
Moderately severe, being managed conservatively.

## 2013-11-12 NOTE — Patient Instructions (Signed)
Your physician recommends that you schedule a follow-up appointment in: 6 months. You will receive a reminder letter in the mail in about 4 months reminding you to call and schedule your appointment. If you don't receive this letter, please contact our office. Your physician recommends that you continue on your current medications as directed. Please refer to the Current Medication list given to you today. Your physician recommends that you have lab work to check your BMET and CBC.

## 2013-11-13 ENCOUNTER — Telehealth: Payer: Self-pay | Admitting: *Deleted

## 2013-11-13 NOTE — Telephone Encounter (Signed)
Message copied by Eustace MooreANDERSON, Elspeth Blucher M on Tue Nov 13, 2013  5:14 PM ------      Message from: Jonelle SidleMCDOWELL, SAMUEL G      Created: Tue Nov 13, 2013  2:41 PM       Reviewed. Renal function normal with creatinine 0.8, potassium 4.3, and normal hemoglobin 12.9. No change to current regimen. ------

## 2013-11-16 ENCOUNTER — Ambulatory Visit: Payer: Medicare Other | Admitting: Cardiology

## 2013-11-19 NOTE — Telephone Encounter (Signed)
Notes Recorded by Lesle ChrisAngela G Cliffie Gingras, LPN on 16/10/960410/19/2015 at 10:09 AM Patient notified and verbalized understanding.

## 2013-12-14 ENCOUNTER — Other Ambulatory Visit: Payer: Self-pay | Admitting: Cardiology

## 2014-02-14 ENCOUNTER — Other Ambulatory Visit: Payer: Self-pay | Admitting: Cardiology

## 2014-04-01 ENCOUNTER — Other Ambulatory Visit: Payer: Self-pay | Admitting: Cardiology

## 2014-05-10 ENCOUNTER — Encounter: Payer: Self-pay | Admitting: Cardiology

## 2014-05-10 ENCOUNTER — Ambulatory Visit (INDEPENDENT_AMBULATORY_CARE_PROVIDER_SITE_OTHER): Payer: Medicare Other | Admitting: Cardiology

## 2014-05-10 VITALS — BP 118/70 | HR 104 | Ht 65.0 in | Wt 178.0 lb

## 2014-05-10 DIAGNOSIS — I482 Chronic atrial fibrillation, unspecified: Secondary | ICD-10-CM

## 2014-05-10 DIAGNOSIS — I38 Endocarditis, valve unspecified: Secondary | ICD-10-CM | POA: Diagnosis not present

## 2014-05-10 NOTE — Progress Notes (Signed)
Cardiology Office Note  Date: 05/10/2014   ID: Brandy MuttonMarion S Winkleman, DOB 11/27/1918, MRN [id number]  PCP: Rosealee AlbeeMARX,RICHARD, MD  Primary Cardiologist: Nona DellSamuel Joseeduardo Brix, MD   Chief Complaint  Patient presents with  . Atrial Fibrillation  . Mitral Regurgitation    History of Present Illness: Brandy Webb is a 81 y.o. female last seen in October 2015. She is here with her son today for a follow-up visit. She [place] in [place]. We reviewed her medications, no changes from a cardiac perspective. She is aware of a sense of palpitations sometimes at nighttime, but sits up and "belches" with improvement in symptoms. It sounds like she has been having some trouble with reflux intermittently as well and uses Pepcid.  She reports no spontaneous bleeding problems on Eliquis. She did have recent lab work with Dr. Noland FordyceMarx, outlined below. We did a follow-up ECG today as well.   Past Medical History  Diagnosis Date  . Essential hypertension, benign   . Chronic atrial fibrillation   . Mixed hyperlipidemia   . Spinal stenosis   . Peripheral neuropathy   . Insomnia   . Mitral regurgitation     Moderate to severe  . Tricuspid regurgitation     Severe  . Secondary pulmonary hypertension     Moderate  . Non-ST elevation MI (NSTEMI)     May 2013 - type 2 event managed medically  . TIA (transient ischemic attack)      Current Outpatient Prescriptions  Medication Sig Dispense Refill  . ALPRAZolam (XANAX) 0.25 MG tablet Take 0.25 mg by mouth at bedtime as needed.     Marland Kitchen. atenolol (TENORMIN) 25 MG tablet TAKE ONE TABLET BY MOUTH TWICE DAILY 60 tablet 11  . clindamycin (CLEOCIN) 150 MG capsule Take 450 mg by mouth as directed.     Marland Kitchen. ELIQUIS 2.5 MG TABS tablet TAKE ONE TABLET BY MOUTH TWICE DAILY 60 tablet 6  . famotidine (PEPCID AC) 10 MG chewable tablet Chew 10 mg by mouth 2 (two) times daily as needed for heartburn.    . furosemide (LASIX) 20 MG tablet TAKE ONE TABLET BY MOUTH ONCE DAILY 30 tablet 5   . levothyroxine (SYNTHROID, LEVOTHROID) 150 MCG tablet Take 150 mcg by mouth daily.      . traMADol (ULTRAM) 50 MG tablet Take 50 mg by mouth every 6 (six) hours as needed.     . traZODone (DESYREL) 50 MG tablet May take 1/2 tab (25mg ) nightly as needed for sleep, may increase to 1 tab (50mg ) if necessary 30 tablet 1   No current facility-administered medications for this visit.    Allergies:  Review of patient's allergies indicates no known allergies.   Social History: The patient  reports that she has never smoked. She has never used smokeless tobacco. She reports that she does not drink alcohol or use illicit drugs.   ROS:  Please see the history of present illness. Otherwise, complete review of systems is positive for intermittent abdominal bloating.  All other systems are reviewed and negative.   Physical Exam: VS:  BP 118/70 mmHg  Pulse 104  Ht 5\' 5"  (1.651 m)  Wt 178 lb (80.74 kg)  BMI 29.62 kg/m2  SpO2 92%, BMI Body mass index is 29.62 kg/(m^2).  Wt Readings from Last 3 Encounters:  05/10/14 178 lb (80.74 kg)  11/12/13 176 lb 1.9 oz (79.888 kg)  06/01/13 173 lb (78.472 kg)     Appears comfortable at rest.  HEENT: Conjunctiva and  lids normal, oropharynx clear.  Neck: Supple, no elevated JVP, no thyromegaly.  Lungs: Diminished BS, clear to auscultation, nonlabored breathing at rest.  Cardiac: Irregularly irregular, no S3, soft apical systolic murmur, no pericardial rub.  Abdomen: Soft, nontender,bowel sounds present, no guarding or rebound.  Extremities: Trace edema, distal pulses    ECG: ECG is ordered today and reviewed showing atrial fibrillation in the 80s with occasional aberrantly conducted complex, lead motion artifact.   Recent Labwork:  11/12/2013: BUN 20, creatinine 0.8, potassium 4.3, hemoglobin 12.9, platelets 169. 04/30/2014: Hemoglobin 13.5, platelets 194, BUN 18, creatinine 0.9, AST 24, ALT 10, TSH 3.1, cholesterol 148, triglycerides 147, HDL 38,  LDL 81.  Assessment and Plan:  1. Chronic atrial fibrillation, mildly symptomatic, continue strategy of heart rate control and anticoagulation. She reports no obvious bleeding problems with Eliquis. Recent lab work reviewed above.  2. Valvular heart disease, being managed conservatively at this time.  Current medicines were reviewed with the patient today.   Disposition: FU with me in 6 months.   Signed, Jonelle Sidle, MD, [facility] 05/10/2014 4:54 PM    [facility] at [facility] [address] [place], [place], [place] [address] Phone: [phone]; Fax: [phone]

## 2014-05-10 NOTE — Patient Instructions (Signed)
Continue all current medications. Your physician wants you to follow up in: 6 months.  You will receive a reminder letter in the mail one-two months in advance.  If you don't receive a letter, please call our office to schedule the follow up appointment   

## 2014-06-17 ENCOUNTER — Encounter: Payer: Self-pay | Admitting: *Deleted

## 2014-06-18 ENCOUNTER — Ambulatory Visit (INDEPENDENT_AMBULATORY_CARE_PROVIDER_SITE_OTHER): Payer: Medicare Other | Admitting: Cardiology

## 2014-06-18 ENCOUNTER — Encounter: Payer: Self-pay | Admitting: Cardiology

## 2014-06-18 VITALS — BP 121/81 | HR 120 | Ht 65.0 in | Wt 171.0 lb

## 2014-06-18 DIAGNOSIS — I482 Chronic atrial fibrillation, unspecified: Secondary | ICD-10-CM

## 2014-06-18 DIAGNOSIS — I38 Endocarditis, valve unspecified: Secondary | ICD-10-CM | POA: Diagnosis not present

## 2014-06-18 DIAGNOSIS — I5033 Acute on chronic diastolic (congestive) heart failure: Secondary | ICD-10-CM | POA: Diagnosis not present

## 2014-06-18 MED ORDER — ATENOLOL 25 MG PO TABS: ORAL_TABLET | ORAL | Status: DC

## 2014-06-18 NOTE — Progress Notes (Signed)
Cardiology Office Note  Date: 06/18/2014   ID: Brandy Webb, MRN [id number]  PCP: Brandy AlbeeMARX,RICHARD, MD  Primary Cardiologist: Brandy DellSamuel McDowell, MD   Chief Complaint  Patient presents with  . Shortness of Breath  . Atrial Fibrillation  . Valvular heart disease    History of Present Illness: Brandy Webb is a 81 y.o. female last seen in April, referred back to the office by Brandy Webb. I reviewed records from an office visit with Brandy Webb just yesterday. She is here with her son today. Since I last saw her, she spent time living with her sister in [place], just recently back in her home in [place], her son assists her regularly and she also has a Comptrollersitter. This past Friday her son states that she was having more trouble with labored breathing, also seemed to notice that her heart rate was faster than normal. In addition she was having trouble with reflux, nausea, feeling of abdominal bloating, son states that she did not eat for nearly 2 days over the weekend. Her weight is down compared to our last visit.  She reports compliance with her medications. Specifically, she has been taking Eliquis, atenolol at 25 mg twice daily, and Lasix at 20 mg daily. After her recent visit with Brandy Webb yesterday, she was started on omeprazole and referred for a chest x-ray as well as lab work. Chest x-ray reported cardiomegaly with pulmonary venous congestion and small bilateral pleural effusions. Lab work is reviewed below. I also reviewed copy of ECG showing atrial fibrillation with aberrantly conducted complexes versus PVC and low voltage with nonspecific T-wave changes. Patient's son states that yesterday afternoon they were told to increase her Lasix to twice daily, and also switched from atenolol at prior dose to Toprol-XL 25 mg daily. She has only taken the additional Lasix yesterday, otherwise made no medication adjustments yet.  Today Brandy Webb states that she does feel better  since the weekend. Her son states that she slept well in the last few nights. Her heart rate is faster than normal, trend has been increasing. This may be leading to decompensated diastolic heart failure in the setting of known valvular disease. Although we discussed the possibility of a follow-up echocardiogram, it is not clear that this would necessarily change our approach, since we are going to manage her medically without further invasive evaluations. They both voiced comfort with this for now.   Past Medical History  Diagnosis Date  . Essential hypertension, benign   . Chronic atrial fibrillation   . Mixed hyperlipidemia   . Spinal stenosis   . Peripheral neuropathy   . Insomnia   . Mitral regurgitation     Moderate to severe  . Tricuspid regurgitation     Severe  . Secondary pulmonary hypertension     Moderate  . Non-ST elevation MI (NSTEMI)     May 2013 - type 2 event managed medically  . TIA (transient ischemic attack)     Past Surgical History  Procedure Laterality Date  . Tonsillectomy    . Dilation and curettage of uterus    . Replacement total knee bilateral      Current Outpatient Prescriptions  Medication Sig Dispense Refill  . ALPRAZolam (XANAX) 0.25 MG tablet Take 0.25 mg by mouth at bedtime as needed.     . clindamycin (CLEOCIN) 300 MG capsule Take 450 mg by mouth.    . ELIQUIS 2.5 MG TABS tablet TAKE ONE TABLET BY MOUTH  TWICE DAILY 60 tablet 6  . furosemide (LASIX) 20 MG tablet TAKE ONE TABLET BY MOUTH ONCE DAILY 30 tablet 5  . levothyroxine (SYNTHROID, LEVOTHROID) 150 MCG tablet Take 150 mcg by mouth daily.      Marland Kitchen. omeprazole (PRILOSEC) 20 MG capsule Take 20 mg by mouth.    . traMADol (ULTRAM) 50 MG tablet Take 50 mg by mouth every 6 (six) hours as needed.     . traZODone (DESYREL) 50 MG tablet May take 1/2 tab (25mg ) nightly as needed for sleep, may increase to 1 tab (50mg ) if necessary 30 tablet 1  . atenolol (TENORMIN) 25 MG tablet Take 50 mg in the am,  and 25 mg in the pm 270 tablet 3   No current facility-administered medications for this visit.    Allergies:  Review of patient's allergies indicates no known allergies.   Social History: The patient  reports that she has never smoked. She has never used smokeless tobacco. She reports that she does not drink alcohol or use illicit drugs.   ROS:  Please see the history of present illness. Otherwise, complete review of systems is positive for decreased appetite, trying to drink more fluids.  All other systems are reviewed and negative.   Physical Exam: VS:  BP 121/81 mmHg  Pulse 120  Ht 5\' 5"  (1.651 m)  Wt 171 lb (77.565 kg)  BMI 28.46 kg/m2  SpO2 95%, BMI Body mass index is 28.46 kg/(m^2).  Wt Readings from Last 3 Encounters:  06/18/14 171 lb (77.565 kg)  05/10/14 178 lb (80.74 kg)  11/12/13 176 lb 1.9 oz (79.888 kg)     General: Elderly woman, appears comfortable at rest. HEENT: Conjunctiva and lids normal, oropharynx clear. Neck: Supple, no elevated JVP or carotid bruits, no thyromegaly. Lungs: Generally clear with decreased breath sounds at the bases, nonlabored breathing at rest. Cardiac: Irregularly irregular, no S3, soft apical systolic murmur, no pericardial rub. Abdomen: Soft, nontender, bowel sounds present. Extremities: Trace to 1+ edema, distal pulses 2+. Skin: Warm and dry. Musculoskeletal: No kyphosis. Neuropsychiatric: Alert and oriented x3, affect grossly appropriate.   ECG: ECG is not ordered today.  Recent Labwork:  04/30/2014: Hemoglobin 13.5, platelets 194, BUN 18, creatinine 0.9, AST 24, ALT 10, TSH 3.1, cholesterol 148, triglycerides 147, HDL 38, LDL 81  06/17/2014: BUN 25, creatinine 0.9, potassium 4.2, AST 28, ALT 15, hemoglobin 13.9, hematocrit 39.7, platelets 226, TSH 13.2, proBNP 6503  Other Studies Reviewed Today:  Echocardiogram 06/04/2011 [facility] showed mild LVH with LVEF 60-65%, moderate to severe left atrial enlargement, normal RV  contraction with evidence of increased RV pressures, severe right atrial enlargement, sclerotic aortic valve with mild aortic rotation, moderate to severe mitral regurgitation, severe tricuspid regurgitation with RVSP 63 mmHg, trivial pericardial effusion.   ASSESSMENT AND PLAN:  1. Chronic atrial fibrillation, heart rate is further elevated. Suspect that this is contributing to decompensated diastolic heart failure in the setting of known valvular heart disease as outlined above. I agree with increasing Lasix to 20 mg twice daily for the next week, would then go back to prior daily dosing. We will keep her on atenolol and actually increase her dose to 50 mg the morning and 25 mg in the evening to try to attain better heart rate control. She will otherwise continue Eliquis for now. Close office follow-up arranged.  2. Valvular heart disease including moderate to severe mitral regurgitation and severe tricuspid regurgitation, last assessed in 2013 by echocardiogram. We could certainly pursue a  follow-up echocardiogram, although even if she has had progression, treatment approach will be conservative with medical therapy as she would not be a good candidate for invasive intervention or surgery. Patient and son voiced comfort with this.  3. Essential hypertension, blood pressure is normal today.  Current medicines were reviewed at length with the patient today.   Orders Placed This Encounter  Procedures  . Basic Metabolic Panel (BMET)    Disposition: FU with me in 2 weeks.   Signed, Jonelle Sidle, MD, [facility] 06/18/2014 9:49 AM    [facility] at [facility] [address], [place], [place] [address] Phone: [phone]; Fax: [phone]

## 2014-06-18 NOTE — Patient Instructions (Signed)
Your physician recommends that you schedule a follow-up appointment in: 2 weeks with Dr Diona BrownerMcDowell   Please INCREASE Atenolol to 50 mg am, and 25 mg pm  Please take Lasix 20 mg twice a day for 1 week, then go back to 20 mg, her usual daily dose    Please get lab work BMET just before follow up visit    Thank you for choosing [facility] !

## 2014-07-06 LAB — BASIC METABOLIC PANEL
BUN: 15 mg/dL (ref 6–23)
CO2: 30 meq/L (ref 19–32)
Calcium: 8.6 mg/dL (ref 8.4–10.5)
Chloride: 99 mEq/L (ref 96–112)
Creat: 0.92 mg/dL (ref 0.50–1.10)
Glucose, Bld: 102 mg/dL — ABNORMAL HIGH (ref 70–99)
POTASSIUM: 4.7 meq/L (ref 3.5–5.3)
SODIUM: 138 meq/L (ref 135–145)

## 2014-07-08 ENCOUNTER — Encounter: Payer: Self-pay | Admitting: Cardiology

## 2014-07-08 ENCOUNTER — Ambulatory Visit (INDEPENDENT_AMBULATORY_CARE_PROVIDER_SITE_OTHER): Payer: Medicare Other | Admitting: Cardiology

## 2014-07-08 VITALS — BP 108/58 | HR 107 | Ht 65.0 in | Wt 175.0 lb

## 2014-07-08 DIAGNOSIS — I482 Chronic atrial fibrillation, unspecified: Secondary | ICD-10-CM

## 2014-07-08 DIAGNOSIS — I5033 Acute on chronic diastolic (congestive) heart failure: Secondary | ICD-10-CM

## 2014-07-08 MED ORDER — FUROSEMIDE 20 MG PO TABS: 20.0000 mg | ORAL_TABLET | Freq: Two times a day (BID) | ORAL | Status: DC

## 2014-07-08 NOTE — Patient Instructions (Signed)
Your physician recommends that you schedule a follow-up appointment in: 2-3 weeks with Dr. Diona BrownerMcDowell  INCREASE LASIX 20 MG TWICE DAILY  HAVE LAB FOR BMET DRAWN IN 2 WEEKS (June 20)  Thanks for choosing [facility]!!!

## 2014-07-08 NOTE — Progress Notes (Signed)
Cardiology Office Note  Date: 07/08/2014   ID: Brandy Webb, DOB Apr 20, 1918, MRN [id number]  PCP: Brandy AlbeeMARX,RICHARD, MD  Primary Cardiologist: Brandy DellSamuel Cloe Sockwell, MD   Chief Complaint  Patient presents with  . Atrial Fibrillation  . Valvular heart disease    History of Present Illness: Brandy Webb is a 81 y.o. female seen recently in the office in mid May. At that time we temporarily increased Lasix dose and changed atenolol to 50 mg in the morning and 25 mg in the evening for better heart rate control of atrial fibrillation. We decided not to pursue follow-up echocardiogram in light of overall plan for conservative management of her valvular heart disease. Most recent study in 2013 is noted below.  She is here with her son today. Continues to slow down in terms of functional capacity and shortness of breath with activity. She does not endorse any palpitations. She has had worsening leg edema, has evidence of stasis and a probable hematoma on the right side. She states that she bumped that leg while getting out of the car recently. No fevers or chills. She did have improvement in leg edema when we increased her Lasix, but it has worsened since going back to once daily dose.  Interval follow-up lab work is reviewed below showing normal renal function and potassium.    Past Medical History  Diagnosis Date  . Essential hypertension, benign   . Chronic atrial fibrillation   . Mixed hyperlipidemia   . Spinal stenosis   . Peripheral neuropathy   . Insomnia   . Mitral regurgitation     Moderate to severe  . Tricuspid regurgitation     Severe  . Secondary pulmonary hypertension     Moderate  . Non-ST elevation MI (NSTEMI)     May 2013 - type 2 event managed medically  . TIA (transient ischemic attack)      Current Outpatient Prescriptions  Medication Sig Dispense Refill  . ALPRAZolam (XANAX) 0.25 MG tablet Take 0.25 mg by mouth at bedtime as needed.     Marland Kitchen. atenolol (TENORMIN)  25 MG tablet Take 50 mg in the am, and 25 mg in the pm 270 tablet 3  . clindamycin (CLEOCIN) 300 MG capsule Take 450 mg by mouth. Dental procedures    . ELIQUIS 2.5 MG TABS tablet TAKE ONE TABLET BY MOUTH TWICE DAILY 60 tablet 6  . furosemide (LASIX) 20 MG tablet Take 1 tablet (20 mg total) by mouth 2 (two) times daily. 30 tablet 5  . levothyroxine (SYNTHROID, LEVOTHROID) 150 MCG tablet Take 150 mcg by mouth daily.      Marland Kitchen. omeprazole (PRILOSEC) 20 MG capsule Take 20 mg by mouth.    . traMADol (ULTRAM) 50 MG tablet Take 50 mg by mouth every 6 (six) hours as needed.     . traZODone (DESYREL) 50 MG tablet May take 1/2 tab (25mg ) nightly as needed for sleep, may increase to 1 tab (50mg ) if necessary 30 tablet 1   No current facility-administered medications for this visit.    Allergies:  Review of patient's allergies indicates no known allergies.   Social History: The patient  reports that she has never smoked. She has never used smokeless tobacco. She reports that she does not drink alcohol or use illicit drugs.   ROS:  Please see the history of present illness. Otherwise, complete review of systems is positive for none.  All other systems are reviewed and negative.   Physical Exam: VS:  BP 108/58 mmHg  Pulse 107  Ht  (1.651 m)  Wt 175 lb (79.379 kg)  BMI 29.12 kg/m2, BMI Body mass index is 29.12 kg/(m^2).  Wt Readings from Last 3 Encounters:  07/08/14 175 lb (79.379 kg)  06/18/14 171 lb (77.565 kg)  05/10/14 178 lb (80.74 kg)     General: Elderly woman, appears comfortable at rest. HEENT: Conjunctiva and lids normal, oropharynx clear. Neck: Supple, no elevated JVP or carotid bruits, no thyromegaly. Lungs: Generally clear with decreased breath sounds at the bases, nonlabored breathing at rest. Cardiac: Irregularly irregular, no S3, soft apical systolic murmur, no pericardial rub. Abdomen: Soft, nontender, bowel sounds present. Extremities: 2+ edema with venous stasis and  probable hematoma on the right leg, distal pulses 1-2+. Skin: Warm and dry. Musculoskeletal: No kyphosis. Neuropsychiatric: Alert and oriented x3, affect grossly appropriate.   ECG: ECG is not ordered today.  Recent Labwork: 07/05/2014: BUN 15; Creatinine 0.92; Potassium 4.7; Sodium 138   Other Studies Reviewed Today:  Echocardiogram 06/04/2011 [facility]) showed mild LVH with LVEF 60-65%, moderate to severe left atrial enlargement, normal RV contraction with evidence of increased RV pressures, severe right atrial enlargement, sclerotic aortic valve with mild aortic rotation, moderate to severe mitral regurgitation, severe tricuspid regurgitation with RVSP 63 mmHg, trivial pericardial effusion.  ASSESSMENT AND PLAN:  1. Suspected acute on chronic diastolic heart failure, possibly also RV dysfunction based on known history of valvular heart disease. We have adopted an overall conservative approach following discussion, holding off on follow-up echocardiography. Plan is to resume twice daily Lasix dosing, we may even need to push this higher depending on clinical response and renal function. She will have a BMET in the next few weeks at [place], and I will plan to see her back as well.  2. Chronic atrial fibrillation, continue beta blocker and low-dose Eliquis for now.  Current medicines were reviewed at length with the patient today.  Disposition: FU with me in 2-3 weeks.   Signed, Brandy Sidle, MD, [facility] 07/08/2014 4:06 PM    [facility] at [facility] [address], [place], [place] [address] Phone: [phone]; Fax: [phone]

## 2014-07-09 ENCOUNTER — Telehealth: Payer: Self-pay | Admitting: *Deleted

## 2014-07-09 NOTE — Telephone Encounter (Signed)
Mike informed

## 2014-07-09 NOTE — Telephone Encounter (Signed)
-----   Message from Jonelle SidleSamuel G McDowell, MD sent at 07/08/2014  7:51 AM EDT ----- Reviewed. Renal function and potassium are normal.

## 2014-07-31 LAB — BASIC METABOLIC PANEL
BUN / CREAT RATIO: 18 (ref 11–26)
GFR calc non Af Amer: 50 mL/min/{1.73_m2} — ABNORMAL LOW (ref >59)
GFR, EST AFRICAN AMERICAN: 58 mL/min/{1.73_m2} — AB (ref >59)

## 2014-08-01 ENCOUNTER — Telehealth: Payer: Self-pay | Admitting: *Deleted

## 2014-08-01 NOTE — Telephone Encounter (Signed)
Son informed.

## 2014-08-01 NOTE — Telephone Encounter (Signed)
-----   Message from Jonelle SidleSamuel G McDowell, MD sent at 07/31/2014 12:13 PM EDT ----- Reviewed. Renal function and potassium are stable.

## 2014-08-02 ENCOUNTER — Encounter: Payer: Self-pay | Admitting: Cardiology

## 2014-08-02 ENCOUNTER — Ambulatory Visit (INDEPENDENT_AMBULATORY_CARE_PROVIDER_SITE_OTHER): Payer: Medicare Other | Admitting: Cardiology

## 2014-08-02 VITALS — BP 108/62 | HR 85 | Ht 65.0 in | Wt 172.0 lb

## 2014-08-02 DIAGNOSIS — I482 Chronic atrial fibrillation, unspecified: Secondary | ICD-10-CM

## 2014-08-02 DIAGNOSIS — I5032 Chronic diastolic (congestive) heart failure: Secondary | ICD-10-CM

## 2014-08-02 DIAGNOSIS — I38 Endocarditis, valve unspecified: Secondary | ICD-10-CM | POA: Diagnosis not present

## 2014-08-02 MED ORDER — ATENOLOL 25 MG PO TABS: ORAL_TABLET | ORAL | Status: DC

## 2014-08-02 NOTE — Patient Instructions (Signed)
Your physician recommends that you schedule a follow-up appointment in: 2 months with Dr Diona BrownerMcdowell    Take Atenolol 25 mg in the morning, and 50 mg at night   BMET lab work just before next visit   Thank you for choosing [facility] !

## 2014-08-02 NOTE — Progress Notes (Signed)
Cardiology Office Note  Date: 08/02/2014   ID: APPLE DEARMAS, DOB 06/12/1918, MRN [id number]  PCP: Rosealee Albee, MD  Primary Cardiologist: Nona Dell, MD   Chief Complaint  Patient presents with  . Diastolic heart failure  . Valvular heart disease  . Atrial Fibrillation    History of Present Illness: Brandy Webb is a 81 y.o. female last seen in early June for follow-up. Lasix dose was increased at that time. She is here today with her son for a follow-up visit. Weight is down 3 pounds from last visit. No overall major change in clinical status. She continues to reside at [facility] and does have physical therapy. Blood pressure has been reported to be low intermittently during the daytime, seems to be after her atenolol 50 mg dose in the morning. She denies any worsening dizziness or syncope. Reports NYHA class III dyspnea, no chest pain or palpitations.  Recent follow-up lab work shows stable renal function and potassium. Results are outlined below.  Past Medical History  Diagnosis Date  . Essential hypertension, benign   . Chronic atrial fibrillation   . Mixed hyperlipidemia   . Spinal stenosis   . Peripheral neuropathy   . Insomnia   . Mitral regurgitation     Moderate to severe  . Tricuspid regurgitation     Severe  . Secondary pulmonary hypertension     Moderate  . Non-ST elevation MI (NSTEMI)     May 2013 - type 2 event managed medically  . TIA (transient ischemic attack)      Current Outpatient Prescriptions  Medication Sig Dispense Refill  . ALPRAZolam (XANAX) 0.25 MG tablet Take 0.25 mg by mouth at bedtime as needed.     Marland Kitchen atenolol (TENORMIN) 25 MG tablet Take 50 mg in the am, and 25 mg in the pm 270 tablet 3  . clindamycin (CLEOCIN) 300 MG capsule Take 450 mg by mouth. Dental procedures    . ELIQUIS 2.5 MG TABS tablet TAKE ONE TABLET BY MOUTH TWICE DAILY 60 tablet 6  . furosemide (LASIX) 20 MG tablet Take 1 tablet (20 mg total) by mouth 2 (two)  times daily. 30 tablet 5  . levothyroxine (SYNTHROID, LEVOTHROID) 150 MCG tablet Take 150 mcg by mouth daily.      Marland Kitchen omeprazole (PRILOSEC) 20 MG capsule Take 20 mg by mouth 2 (two) times daily before a meal.     . traMADol (ULTRAM) 50 MG tablet Take 50 mg by mouth every 6 (six) hours as needed.     . traZODone (DESYREL) 50 MG tablet May take 1/2 tab ( ) nightly as needed for sleep, may increase to 1 tab ( ) if necessary 30 tablet 1   No current facility-administered medications for this visit.    Allergies:  Review of patient's allergies indicates no known allergies.   Social History: The patient  reports that she has never smoked. She has never used smokeless tobacco. She reports that she does not drink alcohol or use illicit drugs.   ROS:  Please see the history of present illness. Otherwise, complete review of systems is positive for knee arthritis.  All other systems are reviewed and negative.   Physical Exam: VS:  BP 108/62 mmHg  Pulse 85  Ht  (1.651 m)  Wt 172 lb (78.019 kg)  BMI 28.62 kg/m2  SpO2 96%, BMI Body mass index is 28.62 kg/(m^2).  Wt Readings from Last 3 Encounters:  08/02/14 172 lb (78.019 kg)  07/08/14 175 lb (  79.379 kg)  06/18/14 171 lb (77.565 kg)     General: Elderly woman, appears comfortable at rest. HEENT: Conjunctiva and lids normal, oropharynx clear. Neck: Supple, no elevated JVP or carotid bruits, no thyromegaly. Lungs: Generally clear with decreased breath sounds at the bases, nonlabored breathing at rest. Cardiac: Irregularly irregular, no S3, soft apical systolic murmur, no pericardial rub. Abdomen: Soft, nontender, bowel sounds present. Extremities: 1-2+ edema with venous stasis, distal pulses 1-2+. Skin: Warm and dry. Musculoskeletal: No kyphosis. Neuropsychiatric: Alert and oriented x3, affect grossly appropriate. Hard of hearing.   ECG: ECG is not ordered today.  Recent Labwork: 07/05/2014: BUN 15; Creat 0.92; Potassium 4.7;  Sodium 138  07/23/2014: BUN 16, creatinine 1.0, potassium 4.3, TSH 5.5, T4 8 0.9, proBNP 5774  ASSESSMENT AND PLAN:  1. Chronic diastolic heart failure with probable RV dysfunction as well. Continue conservative medical therapy. No change in current diuretic regimen. Discussed reasonable fluid restrictions. Follow-up BMET for next visit.  2. Chronic atrial fibrillation. She continues on Eliquis. Change dosing of atenolol to 25 mg in the morning and 50 mg in the evening.  Current medicines were reviewed at length with the patient today.  Disposition: FU with me in 2 months.   Signed, Jonelle SidleSamuel G. McDowell, MD, [facility] 08/02/2014 1:23 PM    [facility] at [facility] [address], [place], [place] [address] Phone: [phone]; Fax: [phone]

## 2014-09-05 ENCOUNTER — Telehealth: Payer: Self-pay | Admitting: Cardiology

## 2014-09-05 NOTE — Telephone Encounter (Signed)
Noted. She is already on low-dose Eliquis 2.5 mg twice daily.  I wonder whether she ought to have her nose evaluated by an ENT provider to make sure she does not have a structural reason that she is bleeding, exacerbated by the fact that she is on anticoagulative.  If not, we might have to consider stopping anticoagulation altogether.

## 2014-09-05 NOTE — Telephone Encounter (Signed)
Pt has had 2 nose bleeds since Sunday and she wanted Dr. Diona Browner nurse to know due to the type of medication she is currently taking

## 2014-09-05 NOTE — Telephone Encounter (Signed)
Spoke to PT and she stated that she had a severe nosebleed on Sunday night which took about 30 min. to get under control, and she had another nosebleed on Tuesday night which was not as heavy but still took about 15-20 minutes to get it to stop. She is not prone to nose bleeds, but wanted to inform us due to her medications. Please advise.

## 2014-09-05 NOTE — Telephone Encounter (Signed)
PT's son Brandy Webb notified of Dr. Ival Bible reccomendations. Voiced understanding and will call back if his mother continues to have problems.

## 2014-10-18 ENCOUNTER — Encounter: Payer: Self-pay | Admitting: Cardiology

## 2014-10-18 ENCOUNTER — Ambulatory Visit (INDEPENDENT_AMBULATORY_CARE_PROVIDER_SITE_OTHER): Payer: Medicare Other | Admitting: Cardiology

## 2014-10-18 VITALS — BP 122/74 | HR 57 | Ht 65.5 in | Wt 169.1 lb

## 2014-10-18 DIAGNOSIS — I38 Endocarditis, valve unspecified: Secondary | ICD-10-CM

## 2014-10-18 DIAGNOSIS — I482 Chronic atrial fibrillation, unspecified: Secondary | ICD-10-CM

## 2014-10-18 NOTE — Progress Notes (Signed)
Cardiology Office Note  Date: 10/18/2014   ID: VERDELLA LAIDLAW, DOB March 11, 1918, MRN [id number]  PCP: Rosealee Albee, MD  Primary Cardiologist: Nona Dell, MD   Chief Complaint  Patient presents with  . Valvular heart disease  . Diastolic heart failure  . Atrial Fibrillation    History of Present Illness: Brandy Webb is a 81 y.o. female last seen in July. She is here with her son today for a follow-up visit. She continues to reside at [facility], seems to be reasonably happy there based on our discussion today. She is using a walker, denies any falls since I saw her. She will be completing physical therapy soon, has been doing leg strengthening exercises. Remains short of breath at NYHA class III with activity. No palpitations. She does look more frail and has declined over the last year - her son confirms this as well. Appetite is somewhat limited. Her weight is down a few more pounds.  I reviewed her medications. No major changes from a cardiac perspective. She did have lab work drawn at her facility earlier in the week, I have not received the results yet. Discussed with her son, plan to get lab results sent over for review.   Past Medical History  Diagnosis Date  . Essential hypertension, benign   . Chronic atrial fibrillation   . Mixed hyperlipidemia   . Spinal stenosis   . Peripheral neuropathy   . Insomnia   . Mitral regurgitation     Moderate to severe  . Tricuspid regurgitation     Severe  . Secondary pulmonary hypertension     Moderate  . Non-ST elevation MI (NSTEMI)     May 2013 - type 2 event managed medically  . TIA (transient ischemic attack)     Past Surgical History  Procedure Laterality Date  . Tonsillectomy    . Dilation and curettage of uterus    . Replacement total knee bilateral      Current Outpatient Prescriptions  Medication Sig Dispense Refill  . ALPRAZolam (XANAX) 0.25 MG tablet Take 0.25 mg by mouth at bedtime as needed.     Marland Kitchen  atenolol (TENORMIN) 25 MG tablet Take 25 mg in the am, and 50 mg in the pm 270 tablet 3  . clindamycin (CLEOCIN) 300 MG capsule Take 450 mg by mouth. Dental procedures    . ELIQUIS 2.5 MG TABS tablet TAKE ONE TABLET BY MOUTH TWICE DAILY 60 tablet 6  . furosemide (LASIX) 20 MG tablet Take 1 tablet (20 mg total) by mouth 2 (two) times daily. 30 tablet 5  . levothyroxine (SYNTHROID, LEVOTHROID) 150 MCG tablet Take 150 mcg by mouth daily.      Marland Kitchen omeprazole (PRILOSEC) 20 MG capsule Take 20 mg by mouth 2 (two) times daily before a meal.     . traMADol (ULTRAM) 50 MG tablet Take 50 mg by mouth every 6 (six) hours as needed.     . traZODone (DESYREL) 50 MG tablet May take 1/2 tab ( ) nightly as needed for sleep, may increase to 1 tab ( ) if necessary 30 tablet 1   No current facility-administered medications for this visit.    Allergies:  Review of patient's allergies indicates no known allergies.   Social History: The patient  reports that she has never smoked. She has never used smokeless tobacco. She reports that she does not drink alcohol or use illicit drugs.   ROS:  Please see the history of present illness. Otherwise, complete review  of systems is positive for decreased hearing, chronic leg edema with skin dryness.  All other systems are reviewed and negative.   Physical Exam: VS:  BP 122/74 mmHg  Pulse 57  Ht 5' 5.5" (1.664 m)  Wt 169 lb 1.6 oz (76.703 kg)  BMI 27.70 kg/m2, BMI Body mass index is 27.7 kg/(m^2).  Wt Readings from Last 3 Encounters:  10/18/14 169 lb 1.6 oz (76.703 kg)  08/02/14 172 lb (78.019 kg)  07/08/14 175 lb (79.379 kg)     General: Elderly woman, appears comfortable at rest. HEENT: Conjunctiva and lids normal, oropharynx clear. Neck: Supple, no elevated JVP or carotid bruits, no thyromegaly. Lungs: Generally clear with decreased breath sounds at the bases, nonlabored breathing at rest. Cardiac: Irregularly irregular, no S3, soft apical systolic murmur, no  pericardial rub. Abdomen: Soft, nontender, bowel sounds present. Extremities: 1-2+ edema with venous stasis, distal pulses 1-2+.   ECG: ECG is not ordered today.   ASSESSMENT AND PLAN:  1. Chronic atrial fibrillation, continue strategy of heart rate control and anticoagulation for stroke prophylaxis. She is tolerating her current medical regimen.  2. Valvular heart disease including moderate to severe mitral regurgitation and severe tricuspid regurgitation. Following conservatively. No repeat imaging planned at this time.  Current medicines were reviewed at length with the patient today.  Disposition: FU with me in 2 months.   Signed, Jonelle Sidle, MD, [facility] 10/18/2014 2:28 PM    [facility] at [facility] [address], [place], [place] [address] Phone: [phone]; Fax: [phone]

## 2014-10-18 NOTE — Patient Instructions (Signed)
Your physician recommends that you schedule a follow-up appointment in: 2 MONTHS WITH DR. [facility]  Your physician recommends that you continue on your current medications as directed. Please refer to the Current Medication list given to you today.  Thanks for choosing [facility]!!!

## 2014-10-31 ENCOUNTER — Encounter (HOSPITAL_COMMUNITY): Payer: Self-pay

## 2014-10-31 ENCOUNTER — Inpatient Hospital Stay (HOSPITAL_COMMUNITY)
Admission: EM | Admit: 2014-10-31 | Discharge: 2014-11-07 | DRG: 871 | Disposition: A | Discharge status: Hospice | Payer: Medicare Other | Attending: Family Medicine | Admitting: Family Medicine

## 2014-10-31 ENCOUNTER — Emergency Department (HOSPITAL_COMMUNITY): Payer: Medicare Other

## 2014-10-31 ENCOUNTER — Inpatient Hospital Stay (HOSPITAL_COMMUNITY): Payer: Medicare Other

## 2014-10-31 DIAGNOSIS — N189 Chronic kidney disease, unspecified: Secondary | ICD-10-CM | POA: Diagnosis present

## 2014-10-31 DIAGNOSIS — A419 Sepsis, unspecified organism: Principal | ICD-10-CM | POA: Diagnosis present

## 2014-10-31 DIAGNOSIS — R6521 Severe sepsis with septic shock: Secondary | ICD-10-CM | POA: Diagnosis present

## 2014-10-31 DIAGNOSIS — T68XXXA Hypothermia, initial encounter: Secondary | ICD-10-CM | POA: Diagnosis present

## 2014-10-31 DIAGNOSIS — Z66 Do not resuscitate: Secondary | ICD-10-CM | POA: Diagnosis present

## 2014-10-31 DIAGNOSIS — I4891 Unspecified atrial fibrillation: Secondary | ICD-10-CM | POA: Diagnosis present

## 2014-10-31 DIAGNOSIS — I482 Chronic atrial fibrillation, unspecified: Secondary | ICD-10-CM | POA: Diagnosis present

## 2014-10-31 DIAGNOSIS — L97229 Non-pressure chronic ulcer of left calf with unspecified severity: Secondary | ICD-10-CM | POA: Diagnosis present

## 2014-10-31 DIAGNOSIS — I481 Persistent atrial fibrillation: Secondary | ICD-10-CM | POA: Diagnosis not present

## 2014-10-31 DIAGNOSIS — J9601 Acute respiratory failure with hypoxia: Secondary | ICD-10-CM | POA: Diagnosis not present

## 2014-10-31 DIAGNOSIS — R0600 Dyspnea, unspecified: Secondary | ICD-10-CM | POA: Diagnosis not present

## 2014-10-31 DIAGNOSIS — R627 Adult failure to thrive: Secondary | ICD-10-CM | POA: Diagnosis present

## 2014-10-31 DIAGNOSIS — I2510 Atherosclerotic heart disease of native coronary artery without angina pectoris: Secondary | ICD-10-CM | POA: Diagnosis present

## 2014-10-31 DIAGNOSIS — E43 Unspecified severe protein-calorie malnutrition: Secondary | ICD-10-CM | POA: Diagnosis present

## 2014-10-31 DIAGNOSIS — E872 Acidosis: Secondary | ICD-10-CM | POA: Diagnosis present

## 2014-10-31 DIAGNOSIS — Z8673 Personal history of transient ischemic attack (TIA), and cerebral infarction without residual deficits: Secondary | ICD-10-CM

## 2014-10-31 DIAGNOSIS — I252 Old myocardial infarction: Secondary | ICD-10-CM

## 2014-10-31 DIAGNOSIS — Z515 Encounter for palliative care: Secondary | ICD-10-CM | POA: Diagnosis not present

## 2014-10-31 DIAGNOSIS — I272 Other secondary pulmonary hypertension: Secondary | ICD-10-CM | POA: Diagnosis present

## 2014-10-31 DIAGNOSIS — E782 Mixed hyperlipidemia: Secondary | ICD-10-CM | POA: Diagnosis present

## 2014-10-31 DIAGNOSIS — I081 Rheumatic disorders of both mitral and tricuspid valves: Secondary | ICD-10-CM | POA: Diagnosis present

## 2014-10-31 DIAGNOSIS — E876 Hypokalemia: Secondary | ICD-10-CM | POA: Diagnosis present

## 2014-10-31 DIAGNOSIS — Z6827 Body mass index (BMI) 27.0-27.9, adult: Secondary | ICD-10-CM

## 2014-10-31 DIAGNOSIS — I5033 Acute on chronic diastolic (congestive) heart failure: Secondary | ICD-10-CM | POA: Diagnosis present

## 2014-10-31 DIAGNOSIS — N179 Acute kidney failure, unspecified: Secondary | ICD-10-CM | POA: Diagnosis present

## 2014-10-31 DIAGNOSIS — I1310 Hypertensive heart and chronic kidney disease without heart failure, with stage 1 through stage 4 chronic kidney disease, or unspecified chronic kidney disease: Secondary | ICD-10-CM | POA: Diagnosis present

## 2014-10-31 DIAGNOSIS — J90 Pleural effusion, not elsewhere classified: Secondary | ICD-10-CM | POA: Insufficient documentation

## 2014-10-31 DIAGNOSIS — G9340 Encephalopathy, unspecified: Secondary | ICD-10-CM | POA: Diagnosis present

## 2014-10-31 DIAGNOSIS — R0602 Shortness of breath: Secondary | ICD-10-CM

## 2014-10-31 DIAGNOSIS — I959 Hypotension, unspecified: Secondary | ICD-10-CM | POA: Diagnosis present

## 2014-10-31 HISTORY — DX: Disorder of thyroid, unspecified: E07.9

## 2014-10-31 LAB — MAGNESIUM: Magnesium: 2.5 mg/dL — ABNORMAL HIGH (ref 1.7–2.4)

## 2014-10-31 LAB — URINE MICROSCOPIC-ADD ON

## 2014-10-31 LAB — CBC WITH DIFFERENTIAL/PLATELET
BASOS ABS: 0 10*3/uL (ref 0.0–0.1)
BASOS PCT: 0 %
Eosinophils Absolute: 0.1 10*3/uL (ref 0.0–0.7)
Eosinophils Relative: 2 %
HEMATOCRIT: 46.8 % — AB (ref 36.0–46.0)
HEMOGLOBIN: 14.9 g/dL (ref 12.0–15.0)
LYMPHS PCT: 16 %
Lymphs Abs: 1.2 10*3/uL (ref 0.7–4.0)
MCH: 30.8 pg (ref 26.0–34.0)
MCHC: 31.8 g/dL (ref 30.0–36.0)
MCV: 96.9 fL (ref 78.0–100.0)
MONO ABS: 1 10*3/uL (ref 0.1–1.0)
Monocytes Relative: 14 %
NEUTROS ABS: 4.9 10*3/uL (ref 1.7–7.7)
NEUTROS PCT: 68 %
Platelets: 179 10*3/uL (ref 150–400)
RBC: 4.83 MIL/uL (ref 3.87–5.11)
RDW: 19.7 % — ABNORMAL HIGH (ref 11.5–15.5)
WBC: 7.2 10*3/uL (ref 4.0–10.5)

## 2014-10-31 LAB — URINALYSIS, ROUTINE W REFLEX MICROSCOPIC
Glucose, UA: NEGATIVE mg/dL
Ketones, ur: NEGATIVE mg/dL
LEUKOCYTES UA: NEGATIVE
NITRITE: NEGATIVE
PH: 5.5 (ref 5.0–8.0)
SPECIFIC GRAVITY, URINE: 1.025 (ref 1.005–1.030)
UROBILINOGEN UA: 1 mg/dL (ref 0.0–1.0)

## 2014-10-31 LAB — COMPREHENSIVE METABOLIC PANEL
ALBUMIN: 3.2 g/dL — AB (ref 3.5–5.0)
ALK PHOS: 69 U/L (ref 38–126)
ALT: 10 U/L — ABNORMAL LOW (ref 14–54)
ANION GAP: 9 (ref 5–15)
AST: 22 U/L (ref 15–41)
BILIRUBIN TOTAL: 2 mg/dL — AB (ref 0.3–1.2)
BUN: 44 mg/dL — ABNORMAL HIGH (ref 6–20)
CALCIUM: 8.5 mg/dL — AB (ref 8.9–10.3)
CO2: 32 mmol/L (ref 22–32)
Chloride: 103 mmol/L (ref 101–111)
Creatinine, Ser: 1.51 mg/dL — ABNORMAL HIGH (ref 0.44–1.00)
GFR calc non Af Amer: 28 mL/min — ABNORMAL LOW (ref >60)
GFR, EST AFRICAN AMERICAN: 33 mL/min — AB (ref >60)
GLUCOSE: 111 mg/dL — AB (ref 65–99)
POTASSIUM: 3.3 mmol/L — AB (ref 3.5–5.1)
SODIUM: 144 mmol/L (ref 135–145)
TOTAL PROTEIN: 7.2 g/dL (ref 6.5–8.1)

## 2014-10-31 LAB — BRAIN NATRIURETIC PEPTIDE: B NATRIURETIC PEPTIDE 5: 712 pg/mL — AB (ref 0.0–100.0)

## 2014-10-31 LAB — BLOOD GAS, VENOUS
Acid-Base Excess: 3.9 mmol/L — ABNORMAL HIGH (ref 0.0–2.0)
BICARBONATE: 26.1 meq/L — AB (ref 20.0–24.0)
FIO2: 21
O2 SAT: 57.4 %
PATIENT TEMPERATURE: 37
TCO2: 11.8 mmol/L (ref 0–100)
pCO2, Ven: 49.1 mmHg (ref 45.0–50.0)
pH, Ven: 7.383 — ABNORMAL HIGH (ref 7.250–7.300)
pO2, Ven: 34.8 mmHg (ref 30.0–45.0)

## 2014-10-31 LAB — MRSA PCR SCREENING: MRSA by PCR: NEGATIVE

## 2014-10-31 LAB — I-STAT CHEM 8, ED
BUN: 40 mg/dL — ABNORMAL HIGH (ref 6–20)
CHLORIDE: 100 mmol/L — AB (ref 101–111)
Calcium, Ion: 1.04 mmol/L — ABNORMAL LOW (ref 1.13–1.30)
Creatinine, Ser: 1.5 mg/dL — ABNORMAL HIGH (ref 0.44–1.00)
GLUCOSE: 108 mg/dL — AB (ref 65–99)
HEMATOCRIT: 48 % — AB (ref 36.0–46.0)
HEMOGLOBIN: 16.3 g/dL — AB (ref 12.0–15.0)
POTASSIUM: 3.3 mmol/L — AB (ref 3.5–5.1)
SODIUM: 144 mmol/L (ref 135–145)
TCO2: 30 mmol/L (ref 0–100)

## 2014-10-31 LAB — I-STAT CG4 LACTIC ACID, ED
LACTIC ACID, VENOUS: 2.07 mmol/L — AB (ref 0.5–2.0)
LACTIC ACID, VENOUS: 1.7 mmol/L (ref 0.5–2.0)

## 2014-10-31 LAB — TROPONIN I
TROPONIN I: 0.03 ng/mL (ref <0.031)
TROPONIN I: 0.03 ng/mL (ref <0.031)

## 2014-10-31 LAB — TSH: TSH: 3.361 u[IU]/mL (ref 0.350–4.500)

## 2014-10-31 MED ORDER — VANCOMYCIN HCL 10 G IV SOLR
1500.0000 mg | Freq: Once | INTRAVENOUS | Status: AC
  Administered 2014-10-31: 1500 mg via INTRAVENOUS
  Filled 2014-10-31: qty 1500

## 2014-10-31 MED ORDER — PIPERACILLIN-TAZOBACTAM 3.375 G IVPB 30 MIN
3.3750 g | Freq: Once | INTRAVENOUS | Status: AC
  Administered 2014-10-31: 3.375 g via INTRAVENOUS
  Filled 2014-10-31: qty 50

## 2014-10-31 MED ORDER — VANCOMYCIN HCL IN DEXTROSE 750-5 MG/150ML-% IV SOLN
750.0000 mg | INTRAVENOUS | Status: DC
  Administered 2014-11-01 – 2014-11-03 (×3): 750 mg via INTRAVENOUS
  Filled 2014-10-31 (×5): qty 150

## 2014-10-31 MED ORDER — ONDANSETRON HCL 4 MG PO TABS: 4.0000 mg | ORAL_TABLET | Freq: Four times a day (QID) | ORAL | Status: DC | PRN

## 2014-10-31 MED ORDER — FUROSEMIDE 10 MG/ML IJ SOLN: 20.0000 mg | Freq: Once | INTRAMUSCULAR | Status: DC

## 2014-10-31 MED ORDER — SODIUM CHLORIDE 0.9 % IV SOLN
Freq: Once | INTRAVENOUS | Status: AC
Start: 2014-10-31 — End: 2014-10-31
  Administered 2014-10-31: 10:00:00 via INTRAVENOUS

## 2014-10-31 MED ORDER — SODIUM CHLORIDE 0.9 % IV SOLN
INTRAVENOUS | Status: DC
  Administered 2014-10-31: 16:00:00 via INTRAVENOUS

## 2014-10-31 MED ORDER — ONDANSETRON HCL 4 MG/2ML IJ SOLN: 4.0000 mg | Freq: Four times a day (QID) | INTRAMUSCULAR | Status: DC | PRN

## 2014-10-31 MED ORDER — ENOXAPARIN SODIUM 30 MG/0.3ML ~~LOC~~ SOLN
30.0000 mg | SUBCUTANEOUS | Status: DC
  Administered 2014-10-31: 30 mg via SUBCUTANEOUS
  Filled 2014-10-31: qty 0.3

## 2014-10-31 MED ORDER — FUROSEMIDE 10 MG/ML IJ SOLN
10.0000 mg | Freq: Once | INTRAMUSCULAR | Status: AC
  Administered 2014-10-31: 10 mg via INTRAVENOUS
  Filled 2014-10-31: qty 2

## 2014-10-31 MED ORDER — HYPROMELLOSE (GONIOSCOPIC) 2.5 % OP SOLN
1.0000 [drp] | Freq: Four times a day (QID) | OPHTHALMIC | Status: DC
  Administered 2014-10-31 – 2014-11-05 (×20): 1 [drp] via OPHTHALMIC
  Filled 2014-10-31: qty 15

## 2014-10-31 MED ORDER — POTASSIUM CHLORIDE 10 MEQ/100ML IV SOLN
10.0000 meq | INTRAVENOUS | Status: AC
  Administered 2014-10-31 (×2): 10 meq via INTRAVENOUS
  Filled 2014-10-31: qty 100

## 2014-10-31 MED ORDER — PIPERACILLIN SOD-TAZOBACTAM SO 2.25 (2-0.25) G IV SOLR
2.2500 g | Freq: Four times a day (QID) | INTRAVENOUS | Status: DC
  Administered 2014-10-31 – 2014-11-01 (×4): 2.25 g via INTRAVENOUS
  Filled 2014-10-31 (×16): qty 2.25

## 2014-10-31 MED ORDER — CETYLPYRIDINIUM CHLORIDE 0.05 % MT LIQD
7.0000 mL | Freq: Two times a day (BID) | OROMUCOSAL | Status: DC
  Administered 2014-10-31 – 2014-11-05 (×10): 7 mL via OROMUCOSAL

## 2014-10-31 NOTE — H&P (Signed)
[facility] History and Physical  Brandy Webb ZOX:[id number] DOB: 10/07/1918 DOA: 10/31/2014  Referring physician:  PCP: Rosealee Albee, MD   Chief Complaint: ams  HPI: Brandy Webb is a 81 y.o. female with past medical hx that includes afib on eliquis, hypertension, hyperlipidemia, moderate to severe MR, severe TR, pulmonary hypertension presents to the emergency department with the complaint of persistent worsening shortness of breath and altered mental status. Initial evaluation in the emergency department revealed acute on chronic diastolic heart failure, acute kidney injury, hypotension. Hypothermia. Family reports a worsening shortness of breath over the last week or so. She was seen by cardiology 6 days ago at that time her Lasix was increased to 20 mg twice a day. She has been compliant. Since that time she has gotten progressively weak more confused and less responsive. There's been no reports of complaints of chest pain palpitations cough fever chills. No abdominal pain nausea vomiting diarrhea. No complaints of dysuria hematuria frequency or urgency. Workup in the emergency department reveals venous blood gas with a pH of 7.38 PCO2 49.1 PCO2 34.8 bicarbonate 26.1, proBNP 712, lactic acid 2.07, chest x-ray with cardiomegaly and interstitial edema as well as small bilateral pleural effusions. CT of the head with no acute abnormality initial troponin 0.03. Urinalysis unremarkable EKG with A. fib. She is provided with 10 mg of IV Lasix in the emergency department as well as vancomycin and Zosyn. In the emergency department she was hypothermic with temperature of 95, she is hypotensive with a blood pressure of 83/61 respirations 29 oxygen saturation level 93% on 2 L.  Review of Systems:  10 point review of systems completed with her sign this patient is unable to participate. All negative except as indicated in the history of present illness   Past Medical History  Diagnosis  Date  . Essential hypertension, benign   . Chronic atrial fibrillation   . Mixed hyperlipidemia   . Spinal stenosis   . Peripheral neuropathy   . Insomnia   . Mitral regurgitation     Moderate to severe  . Tricuspid regurgitation     Severe  . Secondary pulmonary hypertension     Moderate  . Non-ST elevation MI (NSTEMI)     May 2013 - type 2 event managed medically  . TIA (transient ischemic attack)   . Thyroid disease    Past Surgical History  Procedure Laterality Date  . Tonsillectomy    . Dilation and curettage of uterus    . Replacement total knee bilateral     Social History:  reports that she has never smoked. She has never used smokeless tobacco. She reports that she does not drink alcohol or use illicit drugs. She currently resides in assisted living facility No Known Allergies  No family history on file. family medical history reviewed and noncontributory to the admission of this elderly lady  Prior to Admission medications   Medication Sig Start Date End Date Taking? Authorizing Provider  ALPRAZolam (XANAX) 0.25 MG tablet Take 0.25 mg by mouth at bedtime as needed.  03/24/12  Yes Historical Provider, MD  atenolol (TENORMIN) 25 MG tablet Take 25 mg in the am, and 50 mg in the pm 08/02/14  Yes Jonelle Sidle, MD  Carboxymethylcellul-Glycerin (OPTIVE) 0.5-0.9 % SOLN Place 1 drop into the right eye 4 (four) times daily.   Yes Historical Provider, MD  clindamycin (CLEOCIN) 300 MG capsule Take 450 mg by mouth. Dental procedures   Yes Historical Provider, MD  Everlene Balls  2.5 MG TABS tablet TAKE ONE TABLET BY MOUTH TWICE DAILY 02/14/14  Yes Jonelle Sidle, MD  furosemide (LASIX) 20 MG tablet Take 1 tablet (20 mg total) by mouth 2 (two) times daily. Patient taking differently: Take 40 mg by mouth 2 (two) times daily.  07/08/14  Yes Jonelle Sidle, MD  levothyroxine (SYNTHROID, LEVOTHROID) 150 MCG tablet Take 150 mcg by mouth daily.     Yes Historical Provider, MD  omeprazole  (PRILOSEC) 20 MG capsule Take 20 mg by mouth 2 (two) times daily before a meal.  06/17/14 06/17/15 Yes Historical Provider, MD  traMADol (ULTRAM) 50 MG tablet Take 50 mg by mouth every 6 (six) hours as needed.  10/18/12  Yes Historical Provider, MD  traZODone (DESYREL) 50 MG tablet May take 1/2 tab (25mg ) nightly as needed for sleep, may increase to 1 tab (50mg ) if necessary Patient taking differently: Take 50 mg by mouth at bedtime as needed.  05/26/11  Yes June Leap, MD   Physical Exam: Filed Vitals:   10/31/14 1200 10/31/14 1245 10/31/14 1300 10/31/14 1315  BP: 103/60 87/50 87/65  83/61  Pulse: 94  80 72  Temp: 95 F (35 C) 95.3 F (35.2 C) 95.4 F (35.2 C) 95.5 F (35.3 C)  TempSrc:      Resp: 31 26 26 29   Height:      Weight:      SpO2: 92% 97% 95% 93%    Wt Readings from Last 3 Encounters:  10/31/14 72.576 kg (160 lb)  10/18/14 76.703 kg (169 lb 1.6 oz)  08/02/14 78.019 kg (172 lb)    General:  Somewhat thin and pale unresponsive except to sternal rub lips and fingertips somewhat cyanotic Eyes: PERRL, normal lids, mucous membranes of her mouth are pale and dry ENT: grossly normal hearing, lips & tongue Neck: no LAD, masses or thyromegaly +JVD Cardiovascular: Irregularly irregular, +murmur lateral lower extremity edema bilateral lower extremities wrapped to knees 1+ edema from knees to thighs   Respiratory: Mild increased work of breathing breath sounds distant and coarse fine crackles Abdomen: soft, sluggish bowel sounds mild diffuse tenderness to palpation no guarding Skin: no rash or induration seen on limited exam Musculoskeletal: grossly normal tone BUE/BLE Psychiatric: grossly normal mood and affect, speech fluent and appropriate Neurologic: grossly non-focal. Facial grimace to fairly vigorous sternal rub otherwise unresponsive.           Labs on Admission:  Basic Metabolic Panel:  Recent Labs Lab 10/31/14 0928 10/31/14 0938  NA 144 144  K 3.3* 3.3*  CL  103 100*  CO2 32  --   GLUCOSE 111* 108*  BUN 44* 40*  CREATININE 1.51* 1.50*  CALCIUM 8.5*  --    Liver Function Tests:  Recent Labs Lab 10/31/14 0928  AST 22  ALT 10*  ALKPHOS 69  BILITOT 2.0*  PROT 7.2  ALBUMIN 3.2*   No results for input(s): LIPASE, AMYLASE in the last 168 hours. No results for input(s): AMMONIA in the last 168 hours. CBC:  Recent Labs Lab 10/31/14 0928 10/31/14 0938  WBC 7.2  --   NEUTROABS 4.9  --   HGB 14.9 16.3*  HCT 46.8* 48.0*  MCV 96.9  --   PLT 179  --    Cardiac Enzymes:  Recent Labs Lab 10/31/14 0928  TROPONINI 0.03    BNP (last 3 results)  Recent Labs  10/31/14 0928  BNP 712.0*    ProBNP (last 3 results) No results for  input(s): PROBNP in the last 8760 hours.  CBG: No results for input(s): GLUCAP in the last 168 hours.  Radiological Exams on Admission: Ct Head Wo Contrast  10/31/2014   CLINICAL DATA:  Patient unresponsive this morning. Initial encounter.  EXAM: CT HEAD WITHOUT CONTRAST  TECHNIQUE: Contiguous axial images were obtained from the base of the skull through the vertex without intravenous contrast.  COMPARISON:  None.  FINDINGS: The patient has a small infarct in the right parietal lobe. Although its chronicity cannot be definitively determined, it appears subacute to remote. There is some cortical atrophy. No hemorrhage, mass lesion, midline shift or abnormal extra-axial fluid collection. No hydrocephalus or pneumocephalus. The calvarium is intact. Imaged paranasal sinuses and mastoid air cells are clear.  IMPRESSION: Small infarct in the right parietal lobe appears remote but its chronicity cannot be definitively determined.  Mild atrophy.   Electronically Signed   By: Drusilla Kanner M.D.   On: 10/31/2014 11:36   Dg Chest Port 1 View  10/31/2014   CLINICAL DATA:  Shortness of breath today. History of congestive heart failure.  EXAM: PORTABLE CHEST 1 VIEW  COMPARISON:  None.  FINDINGS: There is marked  cardiomegaly and mild interstitial edema. Small bilateral pleural effusions are seen. No pneumothorax is identified. Marked degenerative change is seen about the shoulders.  IMPRESSION: Cardiomegaly and interstitial edema with associated small bilateral pleural effusions.   Electronically Signed   By: Drusilla Kanner M.D.   On: 10/31/2014 09:42    EKG: Independently reviewed afib.  Assessment/Plan Principal Problem:   Acute on chronic diastolic heart failure: Etiology unclear. Per chest x-ray and elevated BNP worsening lower extremity edema. She was evaluated by cardiology in the emergency department recommend an updated echo holding off on any Lasix for now as her blood pressure can't tolerate. Of note echo done in 2013 showed a normal EF unable to evaluate diastolic function due to A. fib but presumed abnormal based on severe biatrial enlargement. Will admit to step down. Will continue fluids at keep open only. Will hold off on the Lasix for now given her hypotension. Monitor intake and output and obtain daily weights. Her condition is very guarded. Discussed with son and confirmed DO NOT RESUSCITATE. Active Problems:  Hypotension: I clearly related to above. Will provide very gentle IV fluids given #1. Discussed situation with family. No pressors at this time.  Acute kidney injury: Creatinine on admission 1.3. This is significantly higher than several months ago. May be related to #2 in the setting of recently increased Lasix due to worsening CHF. She had 10 g of IV Lasix. Will hold any nephrotoxins. Monitor urine output  Acute encephalopathy: Likely related to above in the setting of possible infectious process. Will continue Vanco and Zosyn that was initiated in the emergency department. Of note her lactic acid normalized at the point of admission. CT without any acute abnormality. Venous ABG with pH of 7.38 PCO2 49,/o2 34. Son reports improvement from yesterday. Monitor    Pleural effusion: Per  chest x-ray see above    Chronic atrial fibrillation: Home medications include atenolol and L a question. Will hold atenolol for now secondary to #2 patient is not able to take oral medications at this point anyhow. Provide Lovenox       Hypothermia: Related to above. Bear hugger. Monitor closely  Hypokalemia. Mild. Will provide 2 runs of potassium.    Code Status: DNR DVT Prophylaxis: Family Communication: son at bedside Disposition Plan: To be determined  condition very guarded Time spent: 60 minutes  [facility] Pager [phone]

## 2014-10-31 NOTE — ED Notes (Signed)
CRITICAL VALUE ALERT  Critical value received:  Venous blood gas: PH 7.383, CO2 49, PO2 34, Bicarb 26, Sat 57.  Date of notification:  10/31/14  Time of notification:  1017  Critical value read back:Yes.    Nurse who received alert:  Rminter, RN  MD notified (1st page):  Dr. Clayborne Dana  Time of first page:  1017  MD notified (2nd page):  Time of second page:  Responding MD:  Dr. Clayborne Dana  Time MD responded:  1017

## 2014-10-31 NOTE — ED Provider Notes (Signed)
CSN: [id number]     Arrival date & time 10/31/14  0908 History   First MD Initiated Contact with Patient 10/31/14 0914     Chief Complaint  Patient presents with  . Shortness of Breath     (Consider location/radiation/quality/duration/timing/severity/associated sxs/prior Treatment) HPI Comments: 81 yo F from ALF with increasing confusion and dyspnea x 5 days. Wanted to to be sent and admitted yesterday but son refused as he wanted to come in with her. Patient not able to supply much history otherwise.   Patient is a 81 y.o. female presenting with shortness of breath.  Shortness of Breath Severity:  Moderate Onset quality:  Gradual Duration:  5 days Timing:  Constant Progression:  Worsening Chronicity:  New Context: not activity and not pollens   Relieved by:  None tried Worsened by:  Nothing tried Ineffective treatments:  None tried Associated symptoms: no abdominal pain, no chest pain, no cough and no fever     Past Medical History  Diagnosis Date  . Essential hypertension, benign   . Chronic atrial fibrillation   . Mixed hyperlipidemia   . Spinal stenosis   . Peripheral neuropathy   . Insomnia   . Mitral regurgitation     Moderate to severe  . Tricuspid regurgitation     Severe  . Secondary pulmonary hypertension     Moderate  . Non-ST elevation MI (NSTEMI)     May 2013 - type 2 event managed medically  . TIA (transient ischemic attack)   . Thyroid disease    Past Surgical History  Procedure Laterality Date  . Tonsillectomy    . Dilation and curettage of uterus    . Replacement total knee bilateral     History reviewed. No pertinent family history. Social History  Substance Use Topics  . Smoking status: Never Smoker   . Smokeless tobacco: Never Used  . Alcohol Use: No   OB History    No data available     Review of Systems  Constitutional: Negative for fever.  Respiratory: Positive for shortness of breath. Negative for cough.   Cardiovascular:  Negative for chest pain.  Gastrointestinal: Negative for abdominal pain.  Psychiatric/Behavioral: Positive for confusion.  All other systems reviewed and are negative.     Allergies  Review of patient's allergies indicates no known allergies.  Home Medications   Prior to Admission medications   Medication Sig Start Date End Date Taking? Authorizing Provider  ALPRAZolam (XANAX) 0.25 MG tablet Take 0.25 mg by mouth at bedtime as needed.  03/24/12  Yes Historical Provider, MD  atenolol (TENORMIN) 25 MG tablet Take 25 mg in the am, and 50 mg in the pm 08/02/14  Yes Jonelle Sidle, MD  Carboxymethylcellul-Glycerin (OPTIVE) 0.5-0.9 % SOLN Place 1 drop into the right eye 4 (four) times daily.   Yes Historical Provider, MD  clindamycin (CLEOCIN) 300 MG capsule Take 450 mg by mouth. Dental procedures   Yes Historical Provider, MD  ELIQUIS 2.5 MG TABS tablet TAKE ONE TABLET BY MOUTH TWICE DAILY 02/14/14  Yes Jonelle Sidle, MD  furosemide (LASIX) 20 MG tablet Take 1 tablet (20 mg total) by mouth 2 (two) times daily. Patient taking differently: Take 40 mg by mouth 2 (two) times daily.  07/08/14  Yes Jonelle Sidle, MD  levothyroxine (SYNTHROID, LEVOTHROID) 150 MCG tablet Take 150 mcg by mouth daily.     Yes Historical Provider, MD  omeprazole (PRILOSEC) 20 MG capsule Take 20 mg by mouth 2 (two)  times daily before a meal.  06/17/14 06/17/15 Yes Historical Provider, MD  traMADol (ULTRAM) 50 MG tablet Take 50 mg by mouth every 6 (six) hours as needed.  10/18/12  Yes Historical Provider, MD  traZODone (DESYREL) 50 MG tablet May take 1/2 tab ( ) nightly as needed for sleep, may increase to 1 tab ( ) if necessary Patient taking differently: Take 50 mg by mouth at bedtime as needed.  05/26/11  Yes June Leap, MD   BP 94/62 mmHg  Pulse 52  Temp(Src) 96.4 F (35.8 C) (Core (Comment))  Resp 26  Ht  (1.676 m)  Wt 168 lb 6.9 oz (76.4 kg)  BMI 27.20 kg/m2  SpO2 94% Physical Exam   Constitutional: She is oriented to person, place, and time. She appears well-developed and well-nourished.  HENT:  Head: Normocephalic and atraumatic.  Eyes: Conjunctivae and EOM are normal. Right eye exhibits no discharge. Left eye exhibits no discharge.  Cardiovascular: Normal rate.  An irregularly irregular rhythm present.  Pulmonary/Chest: Effort normal. No respiratory distress. She has rales (bilateral lungs throughout).  Abdominal: Soft. She exhibits no distension. There is no tenderness. There is no rebound.  Musculoskeletal: Normal range of motion. She exhibits no edema or tenderness.  Neurological: She is alert and oriented to person, place, and time.  Skin: Skin is warm and dry.  Nursing note and vitals reviewed.   ED Course  Procedures (including critical care time)  CRITICAL CARE Performed by: Marily Memos   Total critical care time: 35 minutes  Critical care time was exclusive of separately billable procedures and treating other patients.  Critical care was necessary to treat or prevent imminent or life-threatening deterioration.  Critical care was time spent personally by me on the following activities: development of treatment plan with patient and/or surrogate as well as nursing, discussions with consultants, evaluation of patient's response to treatment, examination of patient, obtaining history from patient or surrogate, ordering and performing treatments and interventions, ordering and review of laboratory studies, ordering and review of radiographic studies, pulse oximetry and re-evaluation of patient's condition.   Labs Review Labs Reviewed  COMPREHENSIVE METABOLIC PANEL - Abnormal; Notable for the following:    Potassium 3.3 (*)    Glucose, Bld 111 (*)    BUN 44 (*)    Creatinine, Ser 1.51 (*)    Calcium 8.5 (*)    Albumin 3.2 (*)    ALT 10 (*)    Total Bilirubin 2.0 (*)    GFR calc non Af Amer 28 (*)    GFR calc Af Amer 33 (*)    All other  components within normal limits  CBC WITH DIFFERENTIAL/PLATELET - Abnormal; Notable for the following:    HCT 46.8 (*)    RDW 19.7 (*)    All other components within normal limits  URINALYSIS, ROUTINE W REFLEX MICROSCOPIC (NOT AT [facility]) - Abnormal; Notable for the following:    Color, Urine AMBER (*)    Hgb urine dipstick SMALL (*)    Bilirubin Urine SMALL (*)    Protein, ur TRACE (*)    All other components within normal limits  BRAIN NATRIURETIC PEPTIDE - Abnormal; Notable for the following:    B Natriuretic Peptide 712.0 (*)    All other components within normal limits  BLOOD GAS, VENOUS - Abnormal; Notable for the following:    pH, Ven 7.383 (*)    Bicarbonate 26.1 (*)    Acid-Base Excess 3.9 (*)    All other  components within normal limits  URINE MICROSCOPIC-ADD ON - Abnormal; Notable for the following:    Bacteria, UA FEW (*)    Casts HYALINE CASTS (*)    All other components within normal limits  I-STAT CG4 LACTIC ACID, ED - Abnormal; Notable for the following:    Lactic Acid, Venous 2.07 (*)    All other components within normal limits  I-STAT CHEM 8, ED - Abnormal; Notable for the following:    Potassium 3.3 (*)    Chloride 100 (*)    BUN 40 (*)    Creatinine, Ser 1.50 (*)    Glucose, Bld 108 (*)    Calcium, Ion 1.04 (*)    Hemoglobin 16.3 (*)    HCT 48.0 (*)    All other components within normal limits  CULTURE, BLOOD (ROUTINE X 2)  CULTURE, BLOOD (ROUTINE X 2)  URINE CULTURE  MRSA PCR SCREENING  URINE CULTURE  TROPONIN I  TSH  MAGNESIUM  TROPONIN I  I-STAT CG4 LACTIC ACID, ED    Imaging Review Ct Head Wo Contrast  10/31/2014   CLINICAL DATA:  Patient unresponsive this morning. Initial encounter.  EXAM: CT HEAD WITHOUT CONTRAST  TECHNIQUE: Contiguous axial images were obtained from the base of the skull through the vertex without intravenous contrast.  COMPARISON:  None.  FINDINGS: The patient has a small infarct in the right parietal lobe. Although its  chronicity cannot be definitively determined, it appears subacute to remote. There is some cortical atrophy. No hemorrhage, mass lesion, midline shift or abnormal extra-axial fluid collection. No hydrocephalus or pneumocephalus. The calvarium is intact. Imaged paranasal sinuses and mastoid air cells are clear.  IMPRESSION: Small infarct in the right parietal lobe appears remote but its chronicity cannot be definitively determined.  Mild atrophy.   Electronically Signed   By: Drusilla Kanner M.D.   On: 10/31/2014 11:36   Dg Chest Port 1 View  10/31/2014   CLINICAL DATA:  Shortness of breath today. History of congestive heart failure.  EXAM: PORTABLE CHEST 1 VIEW  COMPARISON:  None.  FINDINGS: There is marked cardiomegaly and mild interstitial edema. Small bilateral pleural effusions are seen. No pneumothorax is identified. Marked degenerative change is seen about the shoulders.  IMPRESSION: Cardiomegaly and interstitial edema with associated small bilateral pleural effusions.   Electronically Signed   By: Drusilla Kanner M.D.   On: 10/31/2014 09:42   I have personally reviewed and evaluated these images and lab results as part of my medical decision-making.   EKG Interpretation   Date/Time:  Thursday October 31 2014 09:17:34 EDT Ventricular Rate:  87 PR Interval:    QRS Duration: 97 QT Interval:  407 QTC Calculation: 490 R Axis:   142 Text Interpretation:  Atrial fibrillation Ventricular premature complex  Low voltage, extremity and precordial leads Consider anterior infarct No  previous ECGs available Confirmed by [facility] MD, Barbara Cower [id number]) on 10/31/2014  9:22:27 AM      MDM   Final diagnoses:  Shortness of breath  hypoxic respiratory failure chf exacerbation hypothermia   likeoy chf exacerbation but with the confusion will also do infectious workup. Not septic currently so will hold on abx for time being.  Patient also hypothermic at 95.0 so started on bair hugger and with the  tachypnea is concerning for possible infection so started vancomycin and Zosyn after cultures were drawn. Also concern for possible head bleed as she is on blood thinners and this is been a slow gradual process of CT  head done as well. Secondary to hypothermia-but attention I considered hypothyroidism as a cause for her symptoms and possible myxedema, however her TSH was normal making this less likely. Could be cardiac related however initial troponin was negative discussed with cardiology the need for Lasix with her low blood pressures they stated gentle diuresis would likely be okay. Discussed CODE STATUS with the son who stated the patient would be DO NOT RESUSCITATE but will likely want intubation if it was for reversible process. Secondary to this and discussed the case with the ICU, Dr. Molli Knock, who thought the patient is okay for stepdown discussed the case with Dr. Conley Rolls who admitted the patient to stepdown here.       Marily Memos, MD 10/31/14 [phone]

## 2014-10-31 NOTE — ED Notes (Signed)
[facility] (ALF) called and was given update on Pt's status at this time.

## 2014-10-31 NOTE — ED Notes (Signed)
Hospitalist at bedside at this time talking with pt's son.

## 2014-10-31 NOTE — ED Notes (Signed)
Pt here for evaluation of SOB. Per EMS, pt is being treated for CHF

## 2014-10-31 NOTE — Progress Notes (Signed)
ANTIBIOTIC CONSULT NOTE - INITIAL  Pharmacy Consult for vancomycin and zosyn Indication: pneumonia  No Known Allergies  Patient Measurements: Height:  (162.6 cm) Weight: 160 lb (72.576 kg) IBW/kg (Calculated) : 54.7   Vital Signs: Temp: 95.9 F (35.5 C) (09/29 1400) Temp Source: Rectal (09/29 0940) BP: 100/66 mmHg (09/29 1400) Pulse Rate: 81 (09/29 1400) Intake/Output from previous day:   Intake/Output from this shift:    Labs:  Recent Labs  10/31/14 0928 10/31/14 0938  WBC 7.2  --   HGB 14.9 16.3*  PLT 179  --   CREATININE 1.51* 1.50*   Estimated Creatinine Clearance: 22.4 mL/min (by C-G formula based on Cr of 1.5). No results for input(s): VANCOTROUGH, VANCOPEAK, VANCORANDOM, GENTTROUGH, GENTPEAK, GENTRANDOM, TOBRATROUGH, TOBRAPEAK, TOBRARND, AMIKACINPEAK, AMIKACINTROU, AMIKACIN in the last 72 hours.   Microbiology: No results found for this or any previous visit (from the past 720 hour(s)).  Medical History: Past Medical History  Diagnosis Date  . Essential hypertension, benign   . Chronic atrial fibrillation   . Mixed hyperlipidemia   . Spinal stenosis   . Peripheral neuropathy   . Insomnia   . Mitral regurgitation     Moderate to severe  . Tricuspid regurgitation     Severe  . Secondary pulmonary hypertension     Moderate  . Non-ST elevation MI (NSTEMI)     May 2013 - type 2 event managed medically  . TIA (transient ischemic attack)   . Thyroid disease     Medications:  See medication history Assessment: 81 yo lady admitted with AMS.  She will continue broad spectrum antibiotics.  First doses of vancomycin and zosyn given in the ED.    Goal of Therapy:  Vancomycin trough level 15-20 mcg/ml  Plan:  Cont zosyn 2.25 gm IV q6 hours Cont vancomycin 750 mg IV q24 hours F/u renal function, cultures and clinical course Check vanc trough when appropriate  Thanks for allowing pharmacy to be a part of this patient's care.  Talbert Cage,  PharmD Clinical Pharmacist  10/31/2014,2:23 PM

## 2014-10-31 NOTE — ED Notes (Signed)
Dr. Clayborne Dana gave verbal order to hold fluid bolus at this time until further notice.

## 2014-10-31 NOTE — Consult Note (Addendum)
Primary cardiologist: Dr Nona Dell MD Consulting cardiologist: Dr Dina Rich MD  Clinical Summary Brandy Webb is a 81 y.o.female history of chronic afib, HTN, hyperlipidemia, moderate to severe MR, severe TR, pulm HTN admitted with SOB and AMS. Family reports worsening SOB over the last week or so. Seen in a cardiology clinic in [facility] on Friday, her lasix was increased to  bid at that visit. Since that time she has also become progressively more weak, confused, and less responsive.   ER vitals:  06/2011 echo: LVEF 60-65%, diastolic function not described, moderate to severe pulm HTN, mod to severe MR, severe TR.  BNP 712, Hgb 14.9, Plt 179,  K 3.3, Cr 1.5 (baselien 0.9), BUN 44, lactic acid 2, TSH 3.3, trop neg x1 Venous blood gas 7.38/49 CXR cardiomegaly with pulmonary edema CT head small infarct right parietal, unclear chronicity EKG afib, low voltage   No Known Allergies  Medications Scheduled Medications: . furosemide  20 mg Intravenous Once     Infusions: . vancomycin 1,500 mg (10/31/14 1008)     PRN Medications:     Past Medical History  Diagnosis Date  . Essential hypertension, benign   . Chronic atrial fibrillation   . Mixed hyperlipidemia   . Spinal stenosis   . Peripheral neuropathy   . Insomnia   . Mitral regurgitation     Moderate to severe  . Tricuspid regurgitation     Severe  . Secondary pulmonary hypertension     Moderate  . Non-ST elevation MI (NSTEMI)     May 2013 - type 2 event managed medically  . TIA (transient ischemic attack)   . Thyroid disease     Past Surgical History  Procedure Laterality Date  . Tonsillectomy    . Dilation and curettage of uterus    . Replacement total knee bilateral      No family history on file.  Social History Brandy Webb reports that she has never smoked. She has never used smokeless tobacco. Brandy Webb reports that she does not drink alcohol.  Review of  Systems CONSTITUTIONAL: No weight loss, fever, chills, weakness or fatigue.  HEENT: Eyes: No visual loss, blurred vision, double vision or yellow sclerae. No hearing loss, sneezing, congestion, runny nose or sore throat.  SKIN: No rash or itching.  CARDIOVASCULAR: per HPI RESPIRATORY: No shortness of breath, cough or sputum.  GASTROINTESTINAL: No anorexia, nausea, vomiting or diarrhea. No abdominal pain or blood.  GENITOURINARY: no polyuria, no dysuria NEUROLOGICAL: per HPI MUSCULOSKELETAL: No muscle, back pain, joint pain or stiffness.  HEMATOLOGIC: No anemia, bleeding or bruising.  LYMPHATICS: No enlarged nodes. No history of splenectomy.  PSYCHIATRIC: No history of depression or anxiety.      Physical Examination Blood pressure 83/65, pulse 86, temperature 95.3 F (35.2 C), temperature source Rectal, resp. rate 23, height  (1.626 m), weight 160 lb (72.576 kg), SpO2 96 %. No intake or output data in the 24 hours ending 10/31/14 1155  HEENT: sclera clear  Cardiovascular: irreg, 3/ 6 systolic murmur at apex  Respiratory: coarse bilaterally  GI: abdomen soft, NT, ND  MSK: legs wrapped bilaterally, edema above the knee  Neuro: no focal deficits  Psych: appropriate affect   Lab Results  Basic Metabolic Panel:  Recent Labs Lab 10/31/14 0928 10/31/14 0938  NA 144 144  K 3.3* 3.3*  CL 103 100*  CO2 32  --   GLUCOSE 111* 108*  BUN 44* 40*  CREATININE 1.51* 1.50*  CALCIUM 8.5*  --     Liver Function Tests:  Recent Labs Lab 10/31/14 0928  AST 22  ALT 10*  ALKPHOS 69  BILITOT 2.0*  PROT 7.2  ALBUMIN 3.2*    CBC:  Recent Labs Lab 10/31/14 0928 10/31/14 0938  WBC 7.2  --   NEUTROABS 4.9  --   HGB 14.9 16.3*  HCT 46.8* 48.0*  MCV 96.9  --   PLT 179  --     Cardiac Enzymes:  Recent Labs Lab 10/31/14 0928  TROPONINI 0.03    BNP: Invalid input(s): POCBNP     Recommendations 1. Acute on chronic diastolic HF - echo from 2013 with  normal LVEF, could not evaluate diatolic function due to afib but presumed abnormal based on severe biatrial enlargement - evidence of volume overload by BNP, CXR. By reported weight she is actually down 9 lbs from 10/18/14, unclear how accurate this is. Her Cr and BUN are significantly up from 3 months ago - gentle diuresis given her soft bp's. She has received lasix IV x 1 in ER, will follow uop and not redose at this time.  - repeat echo - CHF would not explain her altered mental status, severe fatigue or hypothermia.   2. Hypothermia - if accurate could be manifestation of infection especially given mildly elevated lactic acid and soft bp's. Hold home bp meds. Her baseline bp appears to be 100s/60s. - history of chronic leg swelling and wounds, could be source of infection. - management per primary team.   3. Afib - chronic, rates are normal - hold atenolol due to soft bp's - continue eliquis.  4. AKI - unclear etiology. Could be related to borderline hypotension, recent lasix increase with CHF, or due to acute infection - consider urine studies including FeUrea since she has been on lasix.    Dina Rich, M.D.

## 2014-11-01 ENCOUNTER — Inpatient Hospital Stay (HOSPITAL_COMMUNITY): Payer: Medicare Other

## 2014-11-01 DIAGNOSIS — I482 Chronic atrial fibrillation: Secondary | ICD-10-CM

## 2014-11-01 DIAGNOSIS — R0600 Dyspnea, unspecified: Secondary | ICD-10-CM

## 2014-11-01 LAB — COMPREHENSIVE METABOLIC PANEL
ALK PHOS: 61 U/L (ref 38–126)
ALT: 10 U/L — AB (ref 14–54)
AST: 21 U/L (ref 15–41)
Albumin: 2.9 g/dL — ABNORMAL LOW (ref 3.5–5.0)
Anion gap: 13 (ref 5–15)
BUN: 45 mg/dL — ABNORMAL HIGH (ref 6–20)
CALCIUM: 8.6 mg/dL — AB (ref 8.9–10.3)
CO2: 28 mmol/L (ref 22–32)
CREATININE: 1.58 mg/dL — AB (ref 0.44–1.00)
Chloride: 106 mmol/L (ref 101–111)
GFR, EST AFRICAN AMERICAN: 31 mL/min — AB (ref >60)
GFR, EST NON AFRICAN AMERICAN: 27 mL/min — AB (ref >60)
Glucose, Bld: 81 mg/dL (ref 65–99)
Potassium: 3.8 mmol/L (ref 3.5–5.1)
Sodium: 147 mmol/L — ABNORMAL HIGH (ref 135–145)
TOTAL PROTEIN: 6.5 g/dL (ref 6.5–8.1)
Total Bilirubin: 1.9 mg/dL — ABNORMAL HIGH (ref 0.3–1.2)

## 2014-11-01 LAB — URINE CULTURE: CULTURE: NO GROWTH

## 2014-11-01 LAB — CBC
HCT: 45.3 % (ref 36.0–46.0)
Hemoglobin: 14.4 g/dL (ref 12.0–15.0)
MCH: 31.1 pg (ref 26.0–34.0)
MCHC: 31.8 g/dL (ref 30.0–36.0)
MCV: 97.8 fL (ref 78.0–100.0)
PLATELETS: 200 10*3/uL (ref 150–400)
RBC: 4.63 MIL/uL (ref 3.87–5.11)
RDW: 19.8 % — ABNORMAL HIGH (ref 11.5–15.5)
WBC: 7 10*3/uL (ref 4.0–10.5)

## 2014-11-01 LAB — PROTIME-INR
INR: 1.73 — AB (ref 0.00–1.49)
PROTHROMBIN TIME: 20.2 s — AB (ref 11.6–15.2)

## 2014-11-01 LAB — TROPONIN I

## 2014-11-01 MED ORDER — ENSURE ENLIVE PO LIQD
237.0000 mL | Freq: Two times a day (BID) | ORAL | Status: DC
  Administered 2014-11-02 – 2014-11-06 (×4): 237 mL via ORAL

## 2014-11-01 MED ORDER — PIPERACILLIN-TAZOBACTAM 3.375 G IVPB
3.3750 g | Freq: Three times a day (TID) | INTRAVENOUS | Status: DC
  Administered 2014-11-01 – 2014-11-06 (×15): 3.375 g via INTRAVENOUS
  Filled 2014-11-01 (×18): qty 50

## 2014-11-01 MED ORDER — ADULT MULTIVITAMIN LIQUID CH
5.0000 mL | Freq: Every day | ORAL | Status: DC
  Administered 2014-11-02 – 2014-11-03 (×2): 5 mL via ORAL
  Filled 2014-11-01 (×6): qty 5

## 2014-11-01 MED ORDER — ENOXAPARIN SODIUM 80 MG/0.8ML ~~LOC~~ SOLN
80.0000 mg | SUBCUTANEOUS | Status: DC
  Administered 2014-11-01 – 2014-11-05 (×5): 80 mg via SUBCUTANEOUS
  Filled 2014-11-01 (×5): qty 0.8

## 2014-11-01 MED ORDER — ADULT MULTIVITAMIN LIQUID CH
5.0000 mL | Freq: Every day | ORAL | Status: DC
  Filled 2014-11-01 (×4): qty 5

## 2014-11-01 MED ORDER — ELDERTONIC PO ELIX
15.0000 mL | ORAL_SOLUTION | Freq: Every day | ORAL | Status: DC
Start: 2014-11-01 — End: 2014-11-01

## 2014-11-01 NOTE — Progress Notes (Signed)
Initial Nutrition Assessment  DOCUMENTATION CODES:   Severe malnutrition in context of chronic illness  INTERVENTION:  - Order Ensure Enlive BID (each supplement contains 350 kcal, and 20 g protein).  - Order geriatric multivitamin.  - Order snacks.  - RD will continue to monitor for needs.     NUTRITION DIAGNOSIS:   Inadequate oral intake related to lethargy/confusion, poor appetite as evidenced by energy intake < 75% for > or equal to 1 month, severe depletion of muscle mass, severe depletion of body fat, moderate to severe fluid accumulation.   GOAL:   Patient will meet greater than or equal to 90% of their needs    MONITOR:   PO intake, Supplement acceptance, Diet advancement, Labs, Weight trends, Skin, I & O's  REASON FOR ASSESSMENT:   Malnutrition Screening Tool    ASSESSMENT:   81 yr old female presenting to ICU with altered mental status, and shortness of breath. Patient diagnoised with acute, chronic diastoic heart failure, sepsis, pulmonary edema and acute kidney hypotension. Previous medical history includes: A. Fib on eliquis, HTN, hyperlipidemia, Mr, TR, Pulmonary HTN.  Patient sitting up in bed, alert and sates she is "hungry" during visit for MST. Patient's daughter and son-in-law were in room and helped to provide assessment data.   Patient described her improved appetite. She stated that over the past few weeks, she has just not felt like eating. Patient described some recent weight loss. Patient has moderate edema in lower legs, and weight loss per chart may be hard to accurately assess. Results of the NFPE indicated that patient has severe fat wasting (orbital, upper arms), and severe muscle wasting (temporal, clavicle, shoulders and hands).  She also has mild-moderate edema, especially in lower extremities.   Patient was a good historian on usual diet. She currently resides at a SNF, and is on a soft diet. She enjoys eggs, bacon and toast in the morning.  Lunch usually includes soup (anything but tomato). She especially enjoys potato soup. Patient enjoys chicken, or pasta for dinner, with a simple lettuce salad. For snacks, she drinks Ensure (vanilla), occassionally Boost, and likes plain yogurts. She mentioned taste changes that have stopped her eating sweet foods. She loves buttermilk, and usually drinks a cup at every meal. She also occasionally drinks diet, decaffineated sodas. She does have history of gastric upset and avoids citrus or tomatoes. As well, she described occasionally bloating.   Labs: Na 147, BUN 45, Cr 1.58, ALT 10, Ca 8.6, K 3.3, bilirubin 1.9, prothrombin time 20.2, mg 2.5, ph 7.833, bicarbonate 26.1.   Medications: prilosec, lasix.   Diet Order:  DIET SOFT Room service appropriate?: Yes; Fluid consistency:: Thin  Skin:  Reviewed, no issues  Last BM:  10/28/2014  Height:   Ht Readings from Last 1 Encounters:  10/31/14  (1.676 m)    Weight:   Wt Readings from Last 1 Encounters:  11/01/14 172 lb 2.9 oz (78.1 kg)    Ideal Body Weight:  54.7 kg  BMI:  Body mass index is 27.8 kg/(m^2).  Estimated Nutritional Needs:   Kcal:  1400-1600 kcal  Protein:  95-105 g  Fluid:  >/ 1.5 L/day  EDUCATION NEEDS:   No education needs identified at this time  Delano Metz, [facility]. BS Dietetic Intern 11/01/2014 12:54 PM

## 2014-11-01 NOTE — Care Management Note (Signed)
Case Management Note  Patient Details  Name: Brandy Webb MRN: [id number] Date of Birth: 10-04-1918  Expected Discharge Date:      11/06/2014            Expected Discharge Plan:  Skilled Nursing Facility  In-House Referral:  Clinical Social Work  Discharge planning Services  CM Consult  Post Acute Care Choice:  NA Choice offered to:  NA  DME Arranged:    DME Agency:     HH Arranged:    HH Agency:     Status of Service:  In process, will continue to follow  Medicare Important Message Given:    Date Medicare IM Given:    Medicare IM give by:    Date Additional Medicare IM Given:    Additional Medicare Important Message give by:     If discussed at Long Length of Stay Meetings, dates discussed:    Additional Comments: Pt admitted in septic shock. Pt is from [facility]. Pt critically ill at this time but anticipate need for SNF at DC if pt recovers. CSW is aware and will cont to follow with CM.  Malcolm Metro, RN 11/01/2014, 2:04 PM

## 2014-11-01 NOTE — Progress Notes (Signed)
Patient ID: Brandy Webb, female   DOB: 1918/12/09, 81 y.o.   MRN: [id number]     Subjective:    More alert this AM. Denies any SOB.   Objective:   Temp:  [94.3 F (34.6 C)-99 F (37.2 C)] 98 F (36.7 C) (09/30 0800) Pulse Rate:  [30-137] 43 (09/30 0800) Resp:  [19-41] 33 (09/30 0800) BP: (77-103)/(47-75) 90/70 mmHg (09/30 0800) SpO2:  [6 %-100 %] 94 % (09/30 0800) Weight:  [168 lb 6.9 oz (76.4 kg)-172 lb 2.9 oz (78.1 kg)] 172 lb 2.9 oz (78.1 kg) (09/30 0400) Last BM Date: 10/30/14  Filed Weights   10/31/14 0923 10/31/14 1458 11/01/14 0400  Weight: 160 lb (72.576 kg) 168 lb 6.9 oz (76.4 kg) 172 lb 2.9 oz (78.1 kg)    Intake/Output Summary (Last 24 hours) at 11/01/14 4098 Last data filed at 11/01/14 0800  Gross per 24 hour  Intake    520 ml  Output    225 ml  Net    295 ml    Telemetry: afib rates 100-120s  Exam:  General: NAD  Resp: Clear anteriorally  Cardiac: irreg, 3/6 systolic murmur at apex  GI: abdomen soft, NT, ND  MSK: legs wrapped bilaterally  Neuro: no focal deficits  Psych: appropriate affect  Lab Results:  Basic Metabolic Panel:  Recent Labs Lab 10/31/14 0928 10/31/14 0938 11/01/14 0404  NA 144 144 147*  K 3.3* 3.3* 3.8  CL 103 100* 106  CO2 32  --  28  GLUCOSE 111* 108* 81  BUN 44* 40* 45*  CREATININE 1.51* 1.50* 1.58*  CALCIUM 8.5*  --  8.6*  MG 2.5*  --   --     Liver Function Tests:  Recent Labs Lab 10/31/14 0928 11/01/14 0404  AST 22 21  ALT 10* 10*  ALKPHOS 69 61  BILITOT 2.0* 1.9*  PROT 7.2 6.5  ALBUMIN 3.2* 2.9*    CBC:  Recent Labs Lab 10/31/14 0928 10/31/14 0938 11/01/14 0404  WBC 7.2  --  7.0  HGB 14.9 16.3* 14.4  HCT 46.8* 48.0* 45.3  MCV 96.9  --  97.8  PLT 179  --  200    Cardiac Enzymes:  Recent Labs Lab 10/31/14 0928 10/31/14 1812 11/01/14 0404  TROPONINI 0.03 0.03 <0.03    BNP: No results for input(s): PROBNP in the last 8760 hours.  Coagulation:  Recent Labs Lab  11/01/14 0404  INR 1.73*    ECG:   Medications:   Scheduled Medications: . antiseptic oral rinse  7 mL Mouth Rinse q12n4p  . enoxaparin (LOVENOX) injection  30 mg Subcutaneous Q24H  . hydroxypropyl methylcellulose / hypromellose  1 drop Right Eye QID  . piperacillin-tazobactam (ZOSYN) IVPB  2.25 g Intravenous Q6H  . vancomycin  750 mg Intravenous Q24H     Infusions: . sodium chloride 10 mL/hr at 11/01/14 0800     PRN Medications:  ondansetron **OR** ondansetron (ZOFRAN) IV     Assessment/Plan    1. Acute on chronic diastolic HF - echo from 2013 with normal LVEF, could not evaluate diatolic function due to afib but presumed abnormal based on severe biatrial enlargement - repeat echo pending - evidence of volume overload by BNP,mild pulm edema by CXR. By reported weight she is actually down 9 lbs from 10/18/14, unclear how accurate this is. Her Cr and BUN are significantly up from 3 months ago  - I/Os incomplete from yesterday, she received lasix  IV x 1 in the ER.  Uptrend in Cr and BUN - I am not convinced she is significantly overloaded. Xray with only mild edema, BNP mildly elevated at 712. Will f/u echo and IVC measurement to help evaluate her CVP. In setting of her hypotension would not aggressively diurese, concerns of vasodilataion and sepsis.     2. Hypothermia - severe on presentation with temps down to 94.3. Suggestive of infection, bp's would suggest septic shock. She is on broadspectrum abx per primary team - temps have normalized this AM, no longer requiring bear hugger.   3. Afib - hold atenolol due to soft bp's. Elevated rates in setting of septic shock 100s-120s. I think this is her physiologic response to her septic shock. Will monitor rates for now, if needed best option would be amiodarone gtt, if neccesary would try starting at /hr as opposed to aggressive load due to her age, fragility and soft bp's.  - eliquis on hold in ICU setting. Only on  DVT prophylaxis dosing of lovenox, will ask pharm to help dose afib stroke prevention dosing given her age and poor renal function   4. AKI - unclear etiology. Could be related to borderline hypotension, recent lasix increase with CHF, or due to acute infection - consider urine studies including FeUrea since she has been on lasix.   5. Leg sores - consider wound care consult      Dina Rich, M.D.

## 2014-11-01 NOTE — Clinical Documentation Improvement (Signed)
Hospitalist  Can the diagnosis of "Leg Sores" be further specified?   Document Specific Site    Document Laterality  Document Ulcer Depth - limited to skin breakdown, with fat layer exposed, with muscle necrosis, with bone necrosis  Etiology of Non-Pressure Ulcers - atherosclerosis, chronic venous hypertension, diabetic ulcer, postphlebotic syndrome, postthrombotic syndrome, varicose ulcer, other, unable to clinically determine, with gangrene   Other condition  Unable to clinically determine  Supporting Information: :  11/01/14 progr note..."5. Leg sores- consider wound care consult"... 10/31/14 H&P..."extremities wrapped to knees 1+ edema from knees to thighs"...  Please exercise your independent, professional judgment when responding. A specific answer is not anticipated or expected.  Thank You, Toribio Harbour, RN, BSN, CCDS Certified Clinical Documentation Specialist [place]: Health Information Management [phone]

## 2014-11-01 NOTE — Progress Notes (Signed)
ANTICOAGULATION CONSULT NOTE - Initial Consult  Pharmacy Consult for lovenox Indication: atrial fibrillation  No Known Allergies  Patient Measurements: Height:  (167.6 cm) Weight: 172 lb 2.9 oz (78.1 kg) IBW/kg (Calculated) : 59.3   Vital Signs: Temp: 98 F (36.7 C) (09/30 0800) BP: 90/70 mmHg (09/30 0800) Pulse Rate: 43 (09/30 0800)  Labs:  Recent Labs  10/31/14 0928 10/31/14 0938 10/31/14 1812 11/01/14 0404  HGB 14.9 16.3*  --  14.4  HCT 46.8* 48.0*  --  45.3  PLT 179  --   --  200  LABPROT  --   --   --  20.2*  INR  --   --   --  1.73*  CREATININE 1.51* 1.50*  --  1.58*  TROPONINI 0.03  --  0.03 <0.03    Estimated Creatinine Clearance: 23 mL/min (by C-G formula based on Cr of 1.58).   Medical History: Past Medical History  Diagnosis Date  . Essential hypertension, benign   . Chronic atrial fibrillation   . Mixed hyperlipidemia   . Spinal stenosis   . Peripheral neuropathy   . Insomnia   . Mitral regurgitation     Moderate to severe  . Tricuspid regurgitation     Severe  . Secondary pulmonary hypertension     Moderate  . Non-ST elevation MI (NSTEMI)     May 2013 - type 2 event managed medically  . TIA (transient ischemic attack)   . Thyroid disease     Medications:  Prescriptions prior to admission  Medication Sig Dispense Refill Last Dose  . ALPRAZolam (XANAX) 0.25 MG tablet Take 0.25 mg by mouth at bedtime as needed.    10/30/2014 at Unknown time  . atenolol (TENORMIN) 25 MG tablet Take 25 mg in the am, and 50 mg in the pm 270 tablet 3 10/30/2014 at 2000  . Carboxymethylcellul-Glycerin (OPTIVE) 0.5-0.9 % SOLN Place 1 drop into the right eye 4 (four) times daily.   10/30/2014 at Unknown time  . clindamycin (CLEOCIN) 300 MG capsule Take 450 mg by mouth. Dental procedures   unknown  . ELIQUIS 2.5 MG TABS tablet TAKE ONE TABLET BY MOUTH TWICE DAILY 60 tablet 6 10/30/2014 at 1900  . furosemide (LASIX) 20 MG tablet Take 1 tablet (20 mg total) by  mouth 2 (two) times daily. (Patient taking differently: Take 40 mg by mouth 2 (two) times daily. ) 30 tablet 5 10/30/2014 at Unknown time  . levothyroxine (SYNTHROID, LEVOTHROID) 150 MCG tablet Take 150 mcg by mouth daily.     10/31/2014 at Unknown time  . omeprazole (PRILOSEC) 20 MG capsule Take 20 mg by mouth 2 (two) times daily before a meal.    10/30/2014 at Unknown time  . traMADol (ULTRAM) 50 MG tablet Take 50 mg by mouth every 6 (six) hours as needed.    Past Week at Unknown time  . traZODone (DESYREL) 50 MG tablet May take 1/2 tab ( ) nightly as needed for sleep, may increase to 1 tab ( ) if necessary (Patient taking differently: Take 50 mg by mouth at bedtime as needed. ) 30 tablet 1 10/30/2014 at Unknown time    Assessment: 81 yo lady to start lovenox for afib.  She received lovenox 30 mg sq yesterday at 16:00.  She was on eliquis pta. Goal of Therapy:  Anti-Xa level 0.6-1 units/ml 4hrs after LMWH dose given Monitor platelets by anticoagulation protocol: Yes   Plan:  Lovenox 80 mg sq q24 hours. F/u renal function and  CBC Monitor for bleeding complications.  Daliya Parchment Poteet 11/01/2014,10:14 AM

## 2014-11-01 NOTE — Care Management Important Message (Signed)
Important Message  Patient Details  Name: Brandy Webb MRN: [id number] Date of Birth: 1918-09-12   Medicare Important Message Given:  Yes-second notification given    Malcolm Metro, RN 11/01/2014, 2:06 PM

## 2014-11-01 NOTE — Progress Notes (Signed)
[facility] PROGRESS NOTE  Brandy Webb ZOX:[id number] DOB: [birth year] DOA: 10/31/2014 PCP: Rosealee Albee, MD  Assessment/Plan: Acute on chronic diastolic heart failure: Etiology unclear. Per chest x-ray and elevated BNP worsening lower extremity edema. She was evaluated by cardiology in the emergency department recommend an updated echo and holding off on any Lasix.. Of note echo done in 2013 showed a normal EF unable to evaluate diastolic function due to A. fib but presumed abnormal based on severe biatrial enlargement. Not much improvement but does not appear to be worse.  Her condition remaisns very guarded.  Active Problems: Hypotension: likely related to above. SBP reange 80's. Continue very gentle IV fluids given #1. Discussed situation with family. No pressors at this time.  Acute kidney injury: Creatinine on admission 1.5. Stable this am. This is significantly higher than several months ago. May be related to #2 in the setting of recently increased Lasix due to worsening CHF. Holding any nephrotoxins. Urine output low .  Acute encephalopathy: Likely related to above in the setting of possible infectious process. Will continue Vanco and Zosyn day #2. Slightly more responsive this am. Of note her lactic acid normalized at the point of admission. CT without any acute abnormality.    Pleural effusion: Per chest x-ray see above   Chronic atrial fibrillation: Home medications include atenolol and Eliqis Holding atenolol secondary to #2 patient is not able to take oral medications at this point anyhow. Provide Lovenox     Hypothermia:  Resolved this am. Related to above.   Hypokalemia. Mild.supplemented and resolved this am. monitor   Code Status: DNR Family Communication: dr Conley Rolls with son at bedside Disposition Plan: to be determined   Consultants:  cardiology  Procedures:  none  Antibiotics:  Vancomycin 10/31/14>>  Zosyn 10/31/14>>  HPI/Subjective: Lying  in bed eyes closed. Turns head to light touch. Attempts to open eyes when asked.  Objective: Filed Vitals:   11/01/14 0735  BP:   Pulse:   Temp: 98.4 F (36.9 C)  Resp:     Intake/Output Summary (Last 24 hours) at 11/01/14 0853 Last data filed at 11/01/14 0600  Gross per 24 hour  Intake    500 ml  Output    225 ml  Net    275 ml   Filed Weights   10/31/14 0923 10/31/14 1458 11/01/14 0400  Weight: 72.576 kg (160 lb) 76.4 kg (168 lb 6.9 oz) 78.1 kg (172 lb 2.9 oz)    Exam:   General:  Thin frail, pail appears comfortable  Cardiovascular: irregularly irregular bilateral LE edema, LE wrapped  Respiratory: mild increased work of breathing BS coarse  Abdomen: soft +BS non-tender  Musculoskeletal: no clubbing or cyanosis   Data Reviewed: Basic Metabolic Panel:  Recent Labs Lab 10/31/14 0928 10/31/14 0938 11/01/14 0404  NA 144 144 147*  K 3.3* 3.3* 3.8  CL 103 100* 106  CO2 32  --  28  GLUCOSE 111* 108* 81  BUN 44* 40* 45*  CREATININE 1.51* 1.50* 1.58*  CALCIUM 8.5*  --  8.6*  MG 2.5*  --   --    Liver Function Tests:  Recent Labs Lab 10/31/14 0928 11/01/14 0404  AST 22 21  ALT 10* 10*  ALKPHOS 69 61  BILITOT 2.0* 1.9*  PROT 7.2 6.5  ALBUMIN 3.2* 2.9*   No results for input(s): LIPASE, AMYLASE in the last 168 hours. No results for input(s): AMMONIA in the last 168 hours. CBC:  Recent Labs Lab 10/31/14 0928 10/31/14  2536 11/01/14 0404  WBC 7.2  --  7.0  NEUTROABS 4.9  --   --   HGB 14.9 16.3* 14.4  HCT 46.8* 48.0* 45.3  MCV 96.9  --  97.8  PLT 179  --  200   Cardiac Enzymes:  Recent Labs Lab 10/31/14 0928 10/31/14 1812 11/01/14 0404  TROPONINI 0.03 0.03 <0.03   BNP (last 3 results)  Recent Labs  10/31/14 0928  BNP 712.0*    ProBNP (last 3 results) No results for input(s): PROBNP in the last 8760 hours.  CBG: No results for input(s): GLUCAP in the last 168 hours.  Recent Results (from the past 240 hour(s))  MRSA PCR  Screening     Status: None   Collection Time: 10/31/14  6:09 PM  Result Value Ref Range Status   MRSA by PCR NEGATIVE NEGATIVE Final    Comment:        The GeneXpert MRSA Assay (FDA approved for NASAL specimens only), is one component of a comprehensive MRSA colonization surveillance program. It is not intended to diagnose MRSA infection nor to guide or monitor treatment for MRSA infections.      Studies: Ct Head Wo Contrast  10/31/2014   CLINICAL DATA:  Patient unresponsive this morning. Initial encounter.  EXAM: CT HEAD WITHOUT CONTRAST  TECHNIQUE: Contiguous axial images were obtained from the base of the skull through the vertex without intravenous contrast.  COMPARISON:  None.  FINDINGS: The patient has a small infarct in the right parietal lobe. Although its chronicity cannot be definitively determined, it appears subacute to remote. There is some cortical atrophy. No hemorrhage, mass lesion, midline shift or abnormal extra-axial fluid collection. No hydrocephalus or pneumocephalus. The calvarium is intact. Imaged paranasal sinuses and mastoid air cells are clear.  IMPRESSION: Small infarct in the right parietal lobe appears remote but its chronicity cannot be definitively determined.  Mild atrophy.   Electronically Signed   By: Drusilla Kanner M.D.   On: 10/31/2014 11:36   Dg Chest Port 1 View  10/31/2014   CLINICAL DATA:  Shortness of breath today. History of congestive heart failure.  EXAM: PORTABLE CHEST 1 VIEW  COMPARISON:  None.  FINDINGS: There is marked cardiomegaly and mild interstitial edema. Small bilateral pleural effusions are seen. No pneumothorax is identified. Marked degenerative change is seen about the shoulders.  IMPRESSION: Cardiomegaly and interstitial edema with associated small bilateral pleural effusions.   Electronically Signed   By: Drusilla Kanner M.D.   On: 10/31/2014 09:42    Scheduled Meds: . antiseptic oral rinse  7 mL Mouth Rinse q12n4p  . enoxaparin  (LOVENOX) injection  30 mg Subcutaneous Q24H  . hydroxypropyl methylcellulose / hypromellose  1 drop Right Eye QID  . piperacillin-tazobactam (ZOSYN) IVPB  2.25 g Intravenous Q6H  . vancomycin  750 mg Intravenous Q24H   Continuous Infusions: . sodium chloride 10 mL/hr at 11/01/14 0600    Principal Problem:   Acute on chronic diastolic heart failure Active Problems:   Chronic atrial fibrillation   Acute kidney injury   A-fib   Hypothermia   Hypotension   Pleural effusion   Acute encephalopathy   Acute on chronic diastolic HF (heart failure)    Time spent: 35 mintues    [facility] M  [facility] Pager [phone]. If 7PM-7AM, please contact night-coverage at [email or web address], password [facility] 11/01/2014, 8:53 AM  LOS: 1 day

## 2014-11-02 DIAGNOSIS — I481 Persistent atrial fibrillation: Secondary | ICD-10-CM

## 2014-11-02 DIAGNOSIS — E43 Unspecified severe protein-calorie malnutrition: Secondary | ICD-10-CM | POA: Insufficient documentation

## 2014-11-02 LAB — CBC
HCT: 45.1 % (ref 36.0–46.0)
Hemoglobin: 14.4 g/dL (ref 12.0–15.0)
MCH: 30.9 pg (ref 26.0–34.0)
MCHC: 31.9 g/dL (ref 30.0–36.0)
MCV: 96.8 fL (ref 78.0–100.0)
PLATELETS: 181 10*3/uL (ref 150–400)
RBC: 4.66 MIL/uL (ref 3.87–5.11)
RDW: 20.6 % — AB (ref 11.5–15.5)
WBC: 7.8 10*3/uL (ref 4.0–10.5)

## 2014-11-02 LAB — BASIC METABOLIC PANEL
Anion gap: 12 (ref 5–15)
BUN: 44 mg/dL — AB (ref 6–20)
CALCIUM: 8.3 mg/dL — AB (ref 8.9–10.3)
CO2: 24 mmol/L (ref 22–32)
CREATININE: 1.64 mg/dL — AB (ref 0.44–1.00)
Chloride: 107 mmol/L (ref 101–111)
GFR calc Af Amer: 30 mL/min — ABNORMAL LOW (ref >60)
GFR, EST NON AFRICAN AMERICAN: 26 mL/min — AB (ref >60)
GLUCOSE: 119 mg/dL — AB (ref 65–99)
Potassium: 3.9 mmol/L (ref 3.5–5.1)
SODIUM: 143 mmol/L (ref 135–145)

## 2014-11-02 MED ORDER — FUROSEMIDE 10 MG/ML IJ SOLN
20.0000 mg | Freq: Two times a day (BID) | INTRAMUSCULAR | Status: DC
  Administered 2014-11-02 – 2014-11-06 (×9): 20 mg via INTRAVENOUS
  Filled 2014-11-02 (×9): qty 2

## 2014-11-02 MED ORDER — OXYBUTYNIN CHLORIDE 5 MG PO TABS
2.5000 mg | ORAL_TABLET | Freq: Three times a day (TID) | ORAL | Status: DC | PRN
  Administered 2014-11-02 – 2014-11-03 (×3): 2.5 mg via ORAL
  Filled 2014-11-02 (×3): qty 1

## 2014-11-02 NOTE — Progress Notes (Signed)
[facility] PROGRESS NOTE  Brandy Webb ZOX:[id number] DOB: 08/09/1918    PCP:   Rosealee Albee, MD   HPI:   81 yo female admitted with likely septic shock, severe CHF with valvular disease, hypotensive and in pulmonary edema, hypothermic and barely responsive, cyanotic, and is gravely ill. Spoken with her son HCP, who agreed that she is not a candidate for aggressive treatments, and she was subsequently admitted into the ICU for IV antibiotics, IV fluid or diuretics depending on volume status, and supplemtal oxygen, but no intubation, pressor, or cardiac resuscitation. Family is aware that she is gravely ill and may not make it through this admission.  Since then, she has improved some with her mental status. Her BP is still soft, and she is tachycardic. Her BP has been stable but on the low side.   Rewiew of Systems:   Past Medical History  Diagnosis Date  . Essential hypertension, benign   . Chronic atrial fibrillation   . Mixed hyperlipidemia   . Spinal stenosis   . Peripheral neuropathy   . Insomnia   . Mitral regurgitation     Moderate to severe  . Tricuspid regurgitation     Severe  . Secondary pulmonary hypertension     Moderate  . Non-ST elevation MI (NSTEMI)     May 2013 - type 2 event managed medically  . TIA (transient ischemic attack)   . Thyroid disease     Past Surgical History  Procedure Laterality Date  . Tonsillectomy    . Dilation and curettage of uterus    . Replacement total knee bilateral      Medications:  HOME MEDS: Prior to Admission medications   Medication Sig Start Date End Date Taking? Authorizing Provider  ALPRAZolam (XANAX) 0.25 MG tablet Take 0.25 mg by mouth at bedtime as needed.  03/24/12  Yes Historical Provider, MD  atenolol (TENORMIN) 25 MG tablet Take 25 mg in the am, and 50 mg in the pm 08/02/14  Yes Jonelle Sidle, MD  Carboxymethylcellul-Glycerin (OPTIVE) 0.5-0.9 % SOLN Place 1 drop into the right eye 4 (four) times  daily.   Yes Historical Provider, MD  clindamycin (CLEOCIN) 300 MG capsule Take 450 mg by mouth. Dental procedures   Yes Historical Provider, MD  ELIQUIS 2.5 MG TABS tablet TAKE ONE TABLET BY MOUTH TWICE DAILY 02/14/14  Yes Jonelle Sidle, MD  furosemide (LASIX) 20 MG tablet Take 1 tablet (20 mg total) by mouth 2 (two) times daily. Patient taking differently: Take 40 mg by mouth 2 (two) times daily.  07/08/14  Yes Jonelle Sidle, MD  levothyroxine (SYNTHROID, LEVOTHROID) 150 MCG tablet Take 150 mcg by mouth daily.     Yes Historical Provider, MD  omeprazole (PRILOSEC) 20 MG capsule Take 20 mg by mouth 2 (two) times daily before a meal.  06/17/14 06/17/15 Yes Historical Provider, MD  traMADol (ULTRAM) 50 MG tablet Take 50 mg by mouth every 6 (six) hours as needed.  10/18/12  Yes Historical Provider, MD  traZODone (DESYREL) 50 MG tablet May take 1/2 tab ( ) nightly as needed for sleep, may increase to 1 tab ( ) if necessary Patient taking differently: Take 50 mg by mouth at bedtime as needed.  05/26/11  Yes June Leap, MD     Allergies:  No Known Allergies  Social History:   reports that she has never smoked. She has never used smokeless tobacco. She reports that she does not drink alcohol or use illicit  drugs.  Family History: History reviewed. No pertinent family history.   Physical Exam: Filed Vitals:   11/02/14 0400 11/02/14 0435 11/02/14 0500 11/02/14 0600  BP: 90/70  85/63 91/62  Pulse: 114  90 36  Temp: 98.6 F (37 C) 98.7 F (37.1 C) 98.8 F (37.1 C) 98.7 F (37.1 C)  TempSrc:  Core (Comment)    Resp: 37  29 30  Height:      Weight:  77.2 kg (170 lb 3.1 oz)    SpO2: 91%  100% 94%   Blood pressure 91/62, pulse 36, temperature 98.7 F (37.1 C), temperature source Core (Comment), resp. rate 30, height  (1.676 m), weight 77.2 kg (170 lb 3.1 oz), SpO2 94 %.  GEN:  Pleasant  patient lying in the stretcher in no acute distress; cooperative with exam. PSYCH:   alert and oriented x4; does not appear anxious or depressed; affect is appropriate. HEENT: Mucous membranes pink and anicteric; PERRLA; EOM intact; no cervical lymphadenopathy nor thyromegaly or carotid bruit; no JVD; There were no stridor. Neck is very supple. Breasts:: Not examined CHEST WALL: No tenderness CHEST: Normal respiration, clear to auscultation bilaterally.  HEART: Regular rate and rhythm.  There are no murmur, rub, or gallops.   BACK: No kyphosis or scoliosis; no CVA tenderness ABDOMEN: soft and non-tender; no masses, no organomegaly, normal abdominal bowel sounds; no pannus; no intertriginous candida. There is no rebound and no distention. Rectal Exam: Not done EXTREMITIES: No bone or joint deformity; age-appropriate arthropathy of the hands and knees; no edema; no ulcerations.  There is no calf tenderness. Genitalia: not examined PULSES: 2+ and symmetric SKIN: Normal hydration no rash or ulceration CNS: Cranial nerves 2-12 grossly intact no focal lateralizing neurologic deficit.  Speech is fluent; uvula elevated with phonation, facial symmetry and tongue midline. DTR are normal bilaterally, cerebella exam is intact, barbinski is negative and strengths are equaled bilaterally.  No sensory loss.   Labs on Admission:  Basic Metabolic Panel:  Recent Labs Lab 10/31/14 0928 10/31/14 0938 11/01/14 0404 11/02/14 0442  NA 144 144 147* 143  K 3.3* 3.3* 3.8 3.9  CL 103 100* 106 107  CO2 32  --  28 24  GLUCOSE 111* 108* 81 119*  BUN 44* 40* 45* 44*  CREATININE 1.51* 1.50* 1.58* 1.64*  CALCIUM 8.5*  --  8.6* 8.3*  MG 2.5*  --   --   --    Liver Function Tests:  Recent Labs Lab 10/31/14 0928 11/01/14 0404  AST 22 21  ALT 10* 10*  ALKPHOS 69 61  BILITOT 2.0* 1.9*  PROT 7.2 6.5  ALBUMIN 3.2* 2.9*   CBC:  Recent Labs Lab 10/31/14 0928 10/31/14 0938 11/01/14 0404 11/02/14 0442  WBC 7.2  --  7.0 7.8  NEUTROABS 4.9  --   --   --   HGB 14.9 16.3* 14.4 14.4  HCT  46.8* 48.0* 45.3 45.1  MCV 96.9  --  97.8 96.8  PLT 179  --  200 181   Cardiac Enzymes:  Recent Labs Lab 10/31/14 0928 10/31/14 1812 11/01/14 0404  TROPONINI 0.03 0.03 <0.03   Radiological Exams on Admission: Ct Head Wo Contrast  10/31/2014   CLINICAL DATA:  Patient unresponsive this morning. Initial encounter.  EXAM: CT HEAD WITHOUT CONTRAST  TECHNIQUE: Contiguous axial images were obtained from the base of the skull through the vertex without intravenous contrast.  COMPARISON:  None.  FINDINGS: The patient has a small infarct in the  right parietal lobe. Although its chronicity cannot be definitively determined, it appears subacute to remote. There is some cortical atrophy. No hemorrhage, mass lesion, midline shift or abnormal extra-axial fluid collection. No hydrocephalus or pneumocephalus. The calvarium is intact. Imaged paranasal sinuses and mastoid air cells are clear.  IMPRESSION: Small infarct in the right parietal lobe appears remote but its chronicity cannot be definitively determined.  Mild atrophy.   Electronically Signed   By: Drusilla Kanner M.D.   On: 10/31/2014 11:36   Dg Chest Port 1 View  10/31/2014   CLINICAL DATA:  Shortness of breath today. History of congestive heart failure.  EXAM: PORTABLE CHEST 1 VIEW  COMPARISON:  None.  FINDINGS: There is marked cardiomegaly and mild interstitial edema. Small bilateral pleural effusions are seen. No pneumothorax is identified. Marked degenerative change is seen about the shoulders.  IMPRESSION: Cardiomegaly and interstitial edema with associated small bilateral pleural effusions.   Electronically Signed   By: Drusilla Kanner M.D.   On: 10/31/2014 09:42   Assessment/Plan Present on Admission:  . Acute on chronic diastolic heart failure . Chronic atrial fibrillation . Acute kidney injury . A-fib . Hypothermia . Hypotension . Pleural effusion . Acute encephalopathy . Acute on chronic diastolic HF (heart  failure)  PLAN:  Acute on chronic diastolic heart failure: Etiology unclear. Per chest x-ray and elevated BNP worsening lower extremity edema. She was evaluated by cardiology in the emergency department recommend an updated echo and holding off on any Lasix.. Of note echo done in 2013 showed a normal EF unable to evaluate diastolic function due to A. fib but presumed abnormal based on severe biatrial enlargement. Not much improvement but does not appear to be worse. Her condition remaisns very guarded. Will give her IV lasix today.  Hypotension: likely related to above. SBP reange 80's.  Discussed situation with family. No pressors at this time.  Acute kidney injury: Creatinine on admission 1.5. Stable this am. This is significantly higher than several months ago. May be related to #2 in the setting of recently increased Lasix due to worsening CHF. Holding any nephrotoxins. Urine output low .  Acute encephalopathy: Likely related to above in the setting of possible infectious process. Will continue Vanco and Zosyn day #2. Slightly more responsive this am. Of note her lactic acid normalized at the point of admission. CT without any acute abnormality. She has improved and conversed now.    Pleural effusion: Per chest x-ray see above   Chronic atrial fibrillation: Home medications include atenolol and Eliqis Holding atenolol secondary to #2 patient is not able to take oral medications at this point anyhow. Provide Lovenox     Hypothermia: Resolved this am. Related to above.   Hypokalemia. Mild.supplemented and resolved this am. monitor   Other plans as per orders.  Code Status: DNR.  Palliative care.    Houston Siren, MD. [facility] Pager [phone] 7pm to 7am.  11/02/2014, 9:16 AM

## 2014-11-03 LAB — BASIC METABOLIC PANEL
Anion gap: 11 (ref 5–15)
BUN: 46 mg/dL — AB (ref 6–20)
CHLORIDE: 105 mmol/L (ref 101–111)
CO2: 29 mmol/L (ref 22–32)
CREATININE: 1.71 mg/dL — AB (ref 0.44–1.00)
Calcium: 8.2 mg/dL — ABNORMAL LOW (ref 8.9–10.3)
GFR calc Af Amer: 28 mL/min — ABNORMAL LOW (ref >60)
GFR calc non Af Amer: 24 mL/min — ABNORMAL LOW (ref >60)
GLUCOSE: 114 mg/dL — AB (ref 65–99)
Potassium: 3.6 mmol/L (ref 3.5–5.1)
SODIUM: 145 mmol/L (ref 135–145)

## 2014-11-03 MED ORDER — LEVOTHYROXINE SODIUM 75 MCG PO TABS
150.0000 ug | ORAL_TABLET | Freq: Every day | ORAL | Status: DC
  Administered 2014-11-03 – 2014-11-05 (×3): 150 ug via ORAL
  Filled 2014-11-03 (×4): qty 2

## 2014-11-03 MED ORDER — LEVOTHYROXINE SODIUM 100 MCG IV SOLR: 150.0000 ug | Freq: Every day | INTRAVENOUS | Status: DC

## 2014-11-03 MED ORDER — DOCUSATE SODIUM 100 MG PO CAPS
100.0000 mg | ORAL_CAPSULE | Freq: Two times a day (BID) | ORAL | Status: DC
  Administered 2014-11-03 – 2014-11-05 (×3): 100 mg via ORAL
  Filled 2014-11-03 (×4): qty 1

## 2014-11-03 MED ORDER — ZOLPIDEM TARTRATE 5 MG PO TABS
5.0000 mg | ORAL_TABLET | Freq: Every evening | ORAL | Status: DC | PRN
  Administered 2014-11-03: 5 mg via ORAL
  Filled 2014-11-03: qty 1

## 2014-11-03 NOTE — Progress Notes (Signed)
[facility] PROGRESS NOTE  Brandy Webb:[id number] DOB: 04/22/1918    PCP:   Rosealee Albee, MD   HPI:  81 yo female admitted with likely septic shock, severe CHF with valvular disease, hypotensive and in pulmonary edema, hypothermic and barely responsive, cyanotic, and is gravely ill. Spoken with her son HCP, who agreed that she is not a candidate for aggressive treatments, and she was subsequently admitted into the ICU for IV antibiotics, IV fluid or diuretics depending on volume status, and supplemtal oxygen, but no intubation, pressor, or cardiac resuscitation. Family is aware that she is gravely ill and may not make it through this admission.  Since then, she has improved some with her mental status. Her BP is still soft, and she is tachycardic. Her BP has been stable but on the low side.   Rewiew of Systems:  Constitutional: Negative for malaise, fever and chills. No significant weight loss or weight gain Eyes: Negative for eye pain, redness and discharge, diplopia, visual changes, or flashes of light. ENMT: Negative for ear pain, hoarseness, nasal congestion, sinus pressure and sore throat. No headaches; tinnitus, drooling, or problem swallowing. Cardiovascular: Negative for chest pain, palpitations, diaphoresis, dyspnea and peripheral edema. ; No orthopnea, PND Respiratory: Negative for cough, hemoptysis, wheezing and stridor. No pleuritic chestpain. Gastrointestinal: Negative for nausea, vomiting, diarrhea, constipation, abdominal pain, melena, blood in stool, hematemesis, jaundice and rectal bleeding.    Genitourinary: Negative for frequency, dysuria, incontinence,flank pain and hematuria; Musculoskeletal: Negative for back pain and neck pain. Negative for swelling and trauma.;  Skin: . Negative for pruritus, rash, abrasions, bruising and skin lesion.; ulcerations Neuro: Negative for headache, lightheadedness and neck stiffness. Negative for weakness, altered level of  consciousness , altered mental status, extremity weakness, burning feet, involuntary movement, seizure and syncope.  Psych: negative for anxiety, depression, insomnia, tearfulness, panic attacks, hallucinations, paranoia, suicidal or homicidal ideation    Past Medical History  Diagnosis Date  . Essential hypertension, benign   . Chronic atrial fibrillation   . Mixed hyperlipidemia   . Spinal stenosis   . Peripheral neuropathy   . Insomnia   . Mitral regurgitation     Moderate to severe  . Tricuspid regurgitation     Severe  . Secondary pulmonary hypertension     Moderate  . Non-ST elevation MI (NSTEMI)     May 2013 - type 2 event managed medically  . TIA (transient ischemic attack)   . Thyroid disease     Past Surgical History  Procedure Laterality Date  . Tonsillectomy    . Dilation and curettage of uterus    . Replacement total knee bilateral      Medications:  HOME MEDS: Prior to Admission medications   Medication Sig Start Date End Date Taking? Authorizing Provider  ALPRAZolam (XANAX) 0.25 MG tablet Take 0.25 mg by mouth at bedtime as needed.  03/24/12  Yes Historical Provider, MD  atenolol (TENORMIN) 25 MG tablet Take 25 mg in the am, and 50 mg in the pm 08/02/14  Yes Jonelle Sidle, MD  Carboxymethylcellul-Glycerin (OPTIVE) 0.5-0.9 % SOLN Place 1 drop into the right eye 4 (four) times daily.   Yes Historical Provider, MD  clindamycin (CLEOCIN) 300 MG capsule Take 450 mg by mouth. Dental procedures   Yes Historical Provider, MD  ELIQUIS 2.5 MG TABS tablet TAKE ONE TABLET BY MOUTH TWICE DAILY 02/14/14  Yes Jonelle Sidle, MD  furosemide (LASIX) 20 MG tablet Take 1 tablet (20 mg total) by mouth  2 (two) times daily. Patient taking differently: Take 40 mg by mouth 2 (two) times daily.  07/08/14  Yes Jonelle Sidle, MD  levothyroxine (SYNTHROID, LEVOTHROID) 150 MCG tablet Take 150 mcg by mouth daily.     Yes Historical Provider, MD  omeprazole (PRILOSEC) 20 MG capsule  Take 20 mg by mouth 2 (two) times daily before a meal.  06/17/14 06/17/15 Yes Historical Provider, MD  traMADol (ULTRAM) 50 MG tablet Take 50 mg by mouth every 6 (six) hours as needed.  10/18/12  Yes Historical Provider, MD  traZODone (DESYREL) 50 MG tablet May take 1/2 tab ( ) nightly as needed for sleep, may increase to 1 tab ( ) if necessary Patient taking differently: Take 50 mg by mouth at bedtime as needed.  05/26/11  Yes June Leap, MD     Allergies:  No Known Allergies  Social History:   reports that she has never smoked. She has never used smokeless tobacco. She reports that she does not drink alcohol or use illicit drugs.  Family History: History reviewed. No pertinent family history.   Physical Exam: Filed Vitals:   11/03/14 0600 11/03/14 0700 11/03/14 0740 11/03/14 0800  BP: 96/63 91/56  101/62  Pulse: 69 92  104  Temp: 98.1 F (36.7 C) 97.9 F (36.6 C) 97.6 F (36.4 C) 97.7 F (36.5 C)  TempSrc:   Axillary   Resp: Height:      Weight:      SpO2: 95% 100%  90%   Blood pressure 101/62, pulse 104, temperature 97.7 F (36.5 C), temperature source Axillary, resp. rate 18, height  (1.676 m), weight 78.8 kg (173 lb 11.6 oz), SpO2 90 %.  GEN:  Pleasant  patient lying in the stretcher in no acute distress; cooperative with exam. PSYCH:  alert and oriented x4; does not appear anxious or depressed; affect is appropriate. HEENT: Mucous membranes pink and anicteric; PERRLA; EOM intact; no cervical lymphadenopathy nor thyromegaly or carotid bruit; no JVD; There were no stridor. Neck is very supple. Breasts:: Not examined CHEST WALL: No tenderness CHEST: Normal respiration, clear to auscultation bilaterally.  HEART: Regular rate and rhythm.  There are no murmur, rub, or gallops.   BACK: No kyphosis or scoliosis; no CVA tenderness ABDOMEN: soft and non-tender; no masses, no organomegaly, normal abdominal bowel sounds; no pannus; no intertriginous candida.  There is no rebound and no distention. Rectal Exam: Not done EXTREMITIES: No bone or joint deformity; age-appropriate arthropathy of the hands and knees; no edema; no ulcerations.  There is no calf tenderness. Genitalia: not examined PULSES: 2+ and symmetric SKIN: Normal hydration no rash or ulceration CNS: Cranial nerves 2-12 grossly intact no focal lateralizing neurologic deficit.  Speech is fluent; uvula elevated with phonation, facial symmetry and tongue midline. DTR are normal bilaterally, cerebella exam is intact, barbinski is negative and strengths are equaled bilaterally.  No sensory loss.   Labs on Admission:  Basic Metabolic Panel:  Recent Labs Lab 10/31/14 0928 10/31/14 0938 11/01/14 0404 11/02/14 0442 11/03/14 0530  NA 144 144 147* 143 145  K 3.3* 3.3* 3.8 3.9 3.6  CL 103 100* 106 107 105  CO2 32  --  GLUCOSE 111* 108* 81 119* 114*  BUN 44* 40* 45* 44* 46*  CREATININE 1.51* 1.50* 1.58* 1.64* 1.71*  CALCIUM 8.5*  --  8.6* 8.3* 8.2*  MG 2.5*  --   --   --   --  Liver Function Tests:  Recent Labs Lab 10/31/14 0928 11/01/14 0404  AST 22 21  ALT 10* 10*  ALKPHOS 69 61  BILITOT 2.0* 1.9*  PROT 7.2 6.5  ALBUMIN 3.2* 2.9*    CBC:  Recent Labs Lab 10/31/14 0928 10/31/14 0938 11/01/14 0404 11/02/14 0442  WBC 7.2  --  7.0 7.8  NEUTROABS 4.9  --   --   --   HGB 14.9 16.3* 14.4 14.4  HCT 46.8* 48.0* 45.3 45.1  MCV 96.9  --  97.8 96.8  PLT 179  --  200 181   Cardiac Enzymes:  Recent Labs Lab 10/31/14 0928 10/31/14 1812 11/01/14 0404  TROPONINI 0.03 0.03 <0.03    Assessment/Plan Present on Admission:  . Acute on chronic diastolic heart failure (HCC) . Chronic atrial fibrillation (HCC) . Acute kidney injury (HCC) . A-fib (HCC) . Hypothermia . Hypotension . Pleural effusion . Acute encephalopathy . Acute on chronic diastolic HF (heart failure) (HCC)  PLAN:  Acute on chronic diastolic heart failure: Etiology unclear. Per chest  x-ray and elevated BNP worsening lower extremity edema. She was evaluated by cardiology in the emergency department recommend an updated echo and holding off on any Lasix.. Of note echo done in 2013 showed a normal EF unable to evaluate diastolic function due to A. fib but presumed abnormal based on severe biatrial enlargement. Not much improvement but does not appear to be worse. Her condition remaisns very guarded. Currently on low dose IV Lasix BID.  WIll continue. Given her improvement, will d/c foley and obtain physical therapy tomorrow.   Hypotension: likely related to above. SBP have improved to 100's Discussed situation with family. If stable, will transfer her to the floor tomorrow.   Acute kidney injury: Creatinine on admission 1.5. Now 1.7.  She will need to be on the low volume status.  This is significantly higher than several months ago. May be related to #2 in the setting of recently increased Lasix due to worsening CHF. Holding any nephrotoxins.  Acute encephalopathy: Likely related to above in the setting of possible infectious process. Will continue Vanco and Zosyn day #2. Slightly more responsive this am. Of note her lactic acid normalized at the point of admission. CT without any acute abnormality. She has improved and conversed now.    Pleural effusion: Per chest x-ray see above   Chronic atrial fibrillation: Home medications include atenolol and Eliqis Holding atenolol secondary to #2 patient is not able to take oral medications at this point anyhow. Provide Lovenox     Hypothermia: Resolved this am. Related to above.   Hypokalemia. Mild.supplemented and resolved this am. monitor   Other plans as per orders.  Code Status: DNR./Palliative care.    Houston Siren, MD. [facility] Pager [phone] 7pm to 7am.  11/03/2014, 9:25 AM

## 2014-11-03 NOTE — Progress Notes (Signed)
Wound nurse in see patient legs wrapped.

## 2014-11-03 NOTE — Consult Note (Signed)
WOC wound consult note consult completed via Elink  Reason for Consult: LE wound and buttocks Wound type: Venous stasis disease with one small ulcer left upper lateral calf Sacrum intact, some redness but not open Pressure Ulcer POA:No Measurement: left leg 1cm x 1cm x 0.2cm  Wound bed:dry, pink Drainage (amount, consistency, odor) no active drainage, dressing was removed prior to my assessment Periwound: intact, venous dermatitis Dressing procedure/placement/frequency: Silicone foam to the LLE ulcer.  WOC will replace compression wraps bilaterally, until WOC arrives keep LE elevated as much as possible. Sacral prophylactic dressing to protect area.   Discussed POC with patient and bedside nurse.  Re consult if needed, will not follow at this time. Thanks  Kaylob Wallen [employer], CWOCN [phone])

## 2014-11-04 LAB — BASIC METABOLIC PANEL
Anion gap: 9 (ref 5–15)
BUN: 47 mg/dL — ABNORMAL HIGH (ref 6–20)
CALCIUM: 8.2 mg/dL — AB (ref 8.9–10.3)
CHLORIDE: 105 mmol/L (ref 101–111)
CO2: 29 mmol/L (ref 22–32)
CREATININE: 1.72 mg/dL — AB (ref 0.44–1.00)
GFR calc Af Amer: 28 mL/min — ABNORMAL LOW (ref >60)
GFR calc non Af Amer: 24 mL/min — ABNORMAL LOW (ref >60)
GLUCOSE: 111 mg/dL — AB (ref 65–99)
Potassium: 3.3 mmol/L — ABNORMAL LOW (ref 3.5–5.1)
Sodium: 143 mmol/L (ref 135–145)

## 2014-11-04 LAB — VANCOMYCIN, TROUGH: Vancomycin Tr: 26 ug/mL (ref 10.0–20.0)

## 2014-11-04 MED ORDER — ADULT MULTIVITAMIN W/MINERALS CH
1.0000 | ORAL_TABLET | Freq: Every day | ORAL | Status: DC
  Administered 2014-11-05: 1 via ORAL
  Filled 2014-11-04 (×2): qty 1

## 2014-11-04 MED ORDER — MORPHINE SULFATE (PF) 2 MG/ML IV SOLN
2.0000 mg | INTRAVENOUS | Status: DC | PRN
  Administered 2014-11-04: 2 mg via INTRAVENOUS
  Filled 2014-11-04: qty 1

## 2014-11-04 MED ORDER — TRAZODONE HCL 50 MG PO TABS
25.0000 mg | ORAL_TABLET | Freq: Every evening | ORAL | Status: DC | PRN
Start: 2014-11-04 — End: 2014-11-07
  Administered 2014-11-05: 25 mg via ORAL
  Filled 2014-11-04: qty 1

## 2014-11-04 MED ORDER — VANCOMYCIN HCL IN DEXTROSE 1-5 GM/200ML-% IV SOLN
1000.0000 mg | INTRAVENOUS | Status: DC
  Administered 2014-11-05: 1000 mg via INTRAVENOUS
  Filled 2014-11-04 (×2): qty 200

## 2014-11-04 MED ORDER — TRAZODONE 25 MG HALF TABLET: 12.5000 mg | ORAL_TABLET | Freq: Every evening | ORAL | Status: DC | PRN

## 2014-11-04 NOTE — Progress Notes (Signed)
[facility] PROGRESS NOTE  Brandy Webb EAV:[id number] DOB: 24-Oct-1918    PCP:   Rosealee Albee, MD   HPI: HPI: 81 yo female admitted with likely septic shock, severe CHF with valvular disease, hypotensive and in pulmonary edema, hypothermic and barely responsive, cyanotic, and was gravely ill. Spoken with her son HCP, who agreed that she is not a candidate for aggressive treatments, and she was subsequently admitted into the ICU for IV antibiotics, IV fluid or diuretics depending on volume status, and supplemtal oxygen, but no intubation, pressor, or cardiac resuscitation. Family is aware that she is gravely ill and may not make it through this admission.  Since then, she has improved some with her mental status. Her BP is still soft, and she is tachycardic. Her BP has been stable but on the low side.  She is very fragil.  This morning, she went into tachycardia, but is currently at 110 HR. She had some restlessness with her Ambien last night.    Rewiew of Systems:  Constitutional: Negative for malaise, fever and chills. No significant weight loss or weight gain Eyes: Negative for eye pain, redness and discharge, diplopia, visual changes, or flashes of light. ENMT: Negative for ear pain, hoarseness, nasal congestion, sinus pressure and sore throat. No headaches; tinnitus, drooling, or problem swallowing. Cardiovascular: Negative for chest pain, palpitations, diaphoresis, and peripheral edema. ; No orthopnea, PND Respiratory: Negative for cough, hemoptysis, wheezing and stridor. No pleuritic chestpain. Gastrointestinal: Negative for nausea, vomiting, diarrhea, constipation, abdominal pain, melena, blood in stool, hematemesis, jaundice and rectal bleeding.    Genitourinary: Negative for frequency, dysuria, incontinence,flank pain and hematuria; Musculoskeletal: Negative for back pain and neck pain. Negative for swelling and trauma.;  Skin: . Negative for pruritus, rash, abrasions,  bruising and skin lesion.; ulcerations Neuro: Negative for headache, lightheadedness and neck stiffness. Negative for weakness, altered level of consciousness , altered mental status, extremity weakness, burning feet, involuntary movement, seizure and syncope.  Psych: negative for anxiety, depression, insomnia, tearfulness, panic attacks, hallucinations, paranoia, suicidal or homicidal ideation    Past Medical History  Diagnosis Date  . Essential hypertension, benign   . Chronic atrial fibrillation   . Mixed hyperlipidemia   . Spinal stenosis   . Peripheral neuropathy   . Insomnia   . Mitral regurgitation     Moderate to severe  . Tricuspid regurgitation     Severe  . Secondary pulmonary hypertension     Moderate  . Non-ST elevation MI (NSTEMI)     May 2013 - type 2 event managed medically  . TIA (transient ischemic attack)   . Thyroid disease     Past Surgical History  Procedure Laterality Date  . Tonsillectomy    . Dilation and curettage of uterus    . Replacement total knee bilateral      Medications:  HOME MEDS: Prior to Admission medications   Medication Sig Start Date End Date Taking? Authorizing Provider  ALPRAZolam (XANAX) 0.25 MG tablet Take 0.25 mg by mouth at bedtime as needed.  03/24/12  Yes Historical Provider, MD  atenolol (TENORMIN) 25 MG tablet Take 25 mg in the am, and 50 mg in the pm 08/02/14  Yes Jonelle Sidle, MD  Carboxymethylcellul-Glycerin (OPTIVE) 0.5-0.9 % SOLN Place 1 drop into the right eye 4 (four) times daily.   Yes Historical Provider, MD  clindamycin (CLEOCIN) 300 MG capsule Take 450 mg by mouth. Dental procedures   Yes Historical Provider, MD  ELIQUIS 2.5 MG TABS tablet TAKE  ONE TABLET BY MOUTH TWICE DAILY 02/14/14  Yes Jonelle Sidle, MD  furosemide (LASIX) 20 MG tablet Take 1 tablet (20 mg total) by mouth 2 (two) times daily. Patient taking differently: Take 40 mg by mouth 2 (two) times daily.  07/08/14  Yes Jonelle Sidle, MD   levothyroxine (SYNTHROID, LEVOTHROID) 150 MCG tablet Take 150 mcg by mouth daily.     Yes Historical Provider, MD  omeprazole (PRILOSEC) 20 MG capsule Take 20 mg by mouth 2 (two) times daily before a meal.  06/17/14 06/17/15 Yes Historical Provider, MD  traMADol (ULTRAM) 50 MG tablet Take 50 mg by mouth every 6 (six) hours as needed.  10/18/12  Yes Historical Provider, MD  traZODone (DESYREL) 50 MG tablet May take 1/2 tab ( ) nightly as needed for sleep, may increase to 1 tab ( ) if necessary Patient taking differently: Take 50 mg by mouth at bedtime as needed.  05/26/11  Yes June Leap, MD     Allergies:  No Known Allergies  Social History:   reports that she has never smoked. She has never used smokeless tobacco. She reports that she does not drink alcohol or use illicit drugs.  Family History: History reviewed. No pertinent family history.   Physical Exam: Filed Vitals:   11/04/14 0630 11/04/14 0700 11/04/14 0730 11/04/14 0754  BP:   Pulse: 118 112    Temp:    97.9 F (36.6 C)  TempSrc:    Axillary  Resp: 24 32 34   Height:      Weight:      SpO2: 96% 94% 93%    Blood pressure 83/42, pulse 112, temperature 97.9 F (36.6 C), temperature source Axillary, resp. rate 34, height  (1.676 m), weight 78.3 kg (172 lb 9.9 oz), SpO2 93 %.  GEN:  Pleasant  patient lying in the stretcher in no acute distress; cooperative with exam. PSYCH:  alert and oriented x4; does not appear anxious or depressed; affect is appropriate. HEENT: Mucous membranes pink and anicteric; PERRLA; EOM intact; no cervical lymphadenopathy nor thyromegaly or carotid bruit; no JVD; There were no stridor. Neck is very supple. Breasts:: Not examined CHEST WALL: No tenderness CHEST: Normal respiration, clear to auscultation bilaterally.  HEART: Regular rate and rhythm.  There are no murmur, rub, or gallops.   BACK: No kyphosis or scoliosis; no CVA tenderness ABDOMEN: soft and  non-tender; no masses, no organomegaly, normal abdominal bowel sounds; no pannus; no intertriginous candida. There is no rebound and no distention. Rectal Exam: Not done EXTREMITIES: No bone or joint deformity; age-appropriate arthropathy of the hands and knees; no edema; no ulcerations.  There is no calf tenderness. Genitalia: not examined PULSES: 2+ and symmetric SKIN: Normal hydration no rash or ulceration CNS: Cranial nerves 2-12 grossly intact no focal lateralizing neurologic deficit.  Speech is fluent; uvula elevated with phonation, facial symmetry and tongue midline. DTR are normal bilaterally, cerebella exam is intact, barbinski is negative and strengths are equaled bilaterally.  No sensory loss.   Labs on Admission:  Basic Metabolic Panel:  Recent Labs Lab 10/31/14 0928 10/31/14 0938 11/01/14 0404 11/02/14 0442 11/03/14 0530 11/04/14 0504  NA 144 144 147* 143 145 143  K 3.3* 3.3* 3.8 3.9 3.6 3.3*  CL 103 100* 106 107 105 105  CO2 32  --  GLUCOSE 111* 108* 81 119* 114* 111*  BUN 44* 40* 45* 44* 46* 47*  CREATININE 1.51* 1.50*  1.58* 1.64* 1.71* 1.72*  CALCIUM 8.5*  --  8.6* 8.3* 8.2* 8.2*  MG 2.5*  --   --   --   --   --    Liver Function Tests:  Recent Labs Lab 10/31/14 0928 11/01/14 0404  AST 22 21  ALT 10* 10*  ALKPHOS 69 61  BILITOT 2.0* 1.9*  PROT 7.2 6.5  ALBUMIN 3.2* 2.9*   No results for input(s): LIPASE, AMYLASE in the last 168 hours. No results for input(s): AMMONIA in the last 168 hours. CBC:  Recent Labs Lab 10/31/14 0928 10/31/14 0938 11/01/14 0404 11/02/14 0442  WBC 7.2  --  7.0 7.8  NEUTROABS 4.9  --   --   --   HGB 14.9 16.3* 14.4 14.4  HCT 46.8* 48.0* 45.3 45.1  MCV 96.9  --  97.8 96.8  PLT 179  --  200 181   Cardiac Enzymes:  Recent Labs Lab 10/31/14 0928 10/31/14 1812 11/01/14 0404  TROPONINI 0.03 0.03 <0.03   Assessment/Plan Present on Admission:  . Acute on chronic diastolic heart failure (HCC) . Chronic  atrial fibrillation (HCC) . Acute kidney injury (HCC) . A-fib (HCC) . Hypothermia . Hypotension . Pleural effusion . Acute encephalopathy . Acute on chronic diastolic HF (heart failure) (HCC)  PLAN:  Acute on chronic diastolic heart failure: Etiology unclear. Per chest x-ray and elevated BNP worsening lower extremity edema. She was evaluated by cardiology in the emergency department recommend an updated echo and holding off on any Lasix.. Of note echo done in 2013 showed a normal EF unable to evaluate diastolic function due to A. fib but presumed abnormal based on severe biatrial enlargement. Not much improvement but does not appear to be worse. Her condition remaisns very guarded. Currently on low dose IV Lasix BID. WIll continue. Given her improvement, will d/c foley and obtain physical therapy tomorrow.  Family is aware that she is likely to decompensate quickly and unexpectedly, and morphine drip should be started at the appropriate time.   Hypotension: likely related to above. SBP have improved to 100's Discussed situation with family. If stable, will transfer her to the floor tomorrow.   Acute kidney injury: Creatinine on admission 1.5. Now 1.7. She will need to be on the low volume status. This is significantly higher than several months ago. May be related to #2 in the setting of recently increased Lasix due to worsening CHF. Holding any nephrotoxins.  Acute encephalopathy: Likely related to above in the setting of possible infectious process. Will continue Vanco and Zosyn day #2. Slightly more responsive this am. Of note her lactic acid normalized at the point of admission. CT without any acute abnormality. She has improved and conversed now.    Pleural effusion: Per chest x-ray see above   Chronic atrial fibrillation: Home medications include atenolol and Eliqis Holding atenolol secondary to #2 patient is not able to take oral medications at this point anyhow. Provide Lovenox      Hypothermia: Resolved this am. Related to above.   Hypokalemia. Mild.supplemented and resolved this am. monitor   Other plans as per orders.  Code Status: DNR/ Palliative care.    Houston Siren, MD. [facility] Pager [phone] 7pm to 7am.  11/04/2014, 9:11 AM

## 2014-11-04 NOTE — Progress Notes (Signed)
ANTIBIOTIC CONSULT NOTE  Pharmacy Consult for vancomycin,zosyn, and lovenox Indication: pneumonia, afib  No Known Allergies  Patient Measurements: Height:  (167.6 cm) Weight: 172 lb 9.9 oz (78.3 kg) IBW/kg (Calculated) : 59.3   Vital Signs: Temp: 97.9 F (36.6 C) (10/03 0754) Temp Source: Axillary (10/03 0754) BP: 83/42 mmHg (10/03 0730) Pulse Rate: 112 (10/03 0700) Intake/Output from previous day: 10/02 0701 - 10/03 0700 In: 250 [IV Piggyback:250] Out: 200 [Urine:200] Intake/Output from this shift:    Labs:  Recent Labs  11/02/14 0442 11/03/14 0530 11/04/14 0504  WBC 7.8  --   --   HGB 14.4  --   --   PLT 181  --   --   CREATININE 1.64* 1.71* 1.72*   Estimated Creatinine Clearance: 21.1 mL/min (by C-G formula based on Cr of 1.72).  Recent Labs  11/04/14 0844  VANCOTROUGH 26*     Microbiology: Recent Results (from the past 720 hour(s))  Blood Culture (routine x 2)     Status: None (Preliminary result)   Collection Time: 10/31/14  9:28 AM  Result Value Ref Range Status   Specimen Description BLOOD RIGHT ANTECUBITAL  Final   Special Requests   Final    BOTTLES DRAWN AEROBIC AND ANAEROBIC AEB=4CC ANA=3CC   Culture NO GROWTH 4 DAYS  Final   Report Status PENDING  Incomplete  Blood Culture (routine x 2)     Status: None (Preliminary result)   Collection Time: 10/31/14  9:55 AM  Result Value Ref Range Status   Specimen Description BLOOD IV DRAW DRAWN BY RN  Final   Special Requests BOTTLES DRAWN AEROBIC AND ANAEROBIC 5CC EACH  Final   Culture NO GROWTH 4 DAYS  Final   Report Status PENDING  Incomplete  Urine culture     Status: None   Collection Time: 10/31/14 10:30 AM  Result Value Ref Range Status   Specimen Description URINE, CATHETERIZED  Final   Special Requests NONE  Final   Culture   Final    NO GROWTH 1 DAY Performed at [facility]    Report Status 11/01/2014 FINAL  Final  MRSA PCR Screening     Status: None   Collection Time:  10/31/14  6:09 PM  Result Value Ref Range Status   MRSA by PCR NEGATIVE NEGATIVE Final    Comment:        The GeneXpert MRSA Assay (FDA approved for NASAL specimens only), is one component of a comprehensive MRSA colonization surveillance program. It is not intended to diagnose MRSA infection nor to guide or monitor treatment for MRSA infections.     Medical History: Past Medical History  Diagnosis Date  . Essential hypertension, benign   . Chronic atrial fibrillation   . Mixed hyperlipidemia   . Spinal stenosis   . Peripheral neuropathy   . Insomnia   . Mitral regurgitation     Moderate to severe  . Tricuspid regurgitation     Severe  . Secondary pulmonary hypertension     Moderate  . Non-ST elevation MI (NSTEMI)     May 2013 - type 2 event managed medically  . TIA (transient ischemic attack)   . Thyroid disease     Medications:  See medication history Assessment: 81 yo lady admitted with AMS.  She is on broad spectrum antibiotics for PNA.  Her vancomycin trough today is elevated.  She continues on lovenox for afib  Goal of Therapy:  Vancomycin trough level 15-20 mcg/ml  Plan:  Cont zosyn 3.375 gm IV q8 hours Change vancomycin to 1 gm IV q48 hours F/u renal function, cultures and clinical course Thanks for allowing pharmacy to be a part of this patient's care.  Talbert Cage, PharmD Clinical Pharmacist  11/04/2014,10:23 AM

## 2014-11-04 NOTE — Consult Note (Signed)
WOC placed bilateral 4 layer compression wraps.  Patient tolerated well.  Will recommend change again on Thursday of this week.    Davina Poke RN, CWOCN [phone]

## 2014-11-04 NOTE — Progress Notes (Signed)
PT Cancellation Note  Patient Details Name: Brandy Webb MRN: [id number] DOB: 08-23-1918   Cancelled Treatment:    Reason Eval/Treat Not Completed: Medical issues which prohibited therapy.  Per RN, pt is not medically stable.  Will check on her tomorrow.   Konrad Penta  PT 11/04/2014, 9:41 AM [phone]

## 2014-11-04 NOTE — Progress Notes (Deleted)
Orders received for pt to go to 300 on tele; report given to Lisa Covington, RN. Pt transferred in stable condition with all belongings by NT via WC. 

## 2014-11-05 LAB — CULTURE, BLOOD (ROUTINE X 2): CULTURE: NO GROWTH

## 2014-11-05 MED ORDER — MORPHINE SULFATE (PF) 2 MG/ML IV SOLN
2.0000 mg | INTRAVENOUS | Status: DC | PRN
  Administered 2014-11-05 – 2014-11-07 (×2): 2 mg via INTRAVENOUS
  Filled 2014-11-05 (×2): qty 1

## 2014-11-05 NOTE — Progress Notes (Signed)
PT Cancellation Note  Patient Details Name: Brandy Webb MRN: [id number] DOB: May 14, 1918   Cancelled Treatment:    Reason Eval/Treat Not Completed: Medical issues which prohibited therapy;Other (comment). Chart reviewed, RN consulted. Holding pt eval/treatment at this time due to pt vitals being unstable/unsafe, remaining in afib with RVR into 130's bpm. . Pt's son expresses that he feels as though PT services are not appropriate for the pt. PT explained that therapy can also play a strong role in maintaining some quality of life and comfort, even with those with low-level baseline function, however he again expresses disinterest in PT services. Will DC orders at this time. Please add new orders if/when appropriate. RN says palliative consult is currently pending.     Willet Schleifer C 11/05/2014, 9:57 AM  10:02 AM  Rosamaria Lints, PT, DPT [place] License # [id number]

## 2014-11-05 NOTE — Care Management Note (Signed)
Case Management Note  Patient Details  Name: Brandy Webb MRN: [id number] Date of Birth: 02/05/1918  Expected Discharge Date:                  Expected Discharge Plan:  Skilled Nursing Facility  In-House Referral:  Clinical Social Work  Discharge planning Services  CM Consult  Post Acute Care Choice:  NA Choice offered to:  NA  DME Arranged:    DME Agency:     HH Arranged:    HH Agency:     Status of Service:  In process, will continue to follow  Medicare Important Message Given:  Yes-second notification given Date Medicare IM Given:    Medicare IM give by:    Date Additional Medicare IM Given:    Additional Medicare Important Message give by:     If discussed at Long Length of Stay Meetings, dates discussed:  11/05/2014  Additional Comments: Pt transferred to tele today.   Malcolm Metro, RN 11/05/2014, 3:34 PM

## 2014-11-05 NOTE — Progress Notes (Signed)
CRITICAL VALUE ALERT  Critical value received:  Blood Cultures 1 bottle has gram positive rods  Date of notification:  11/04/2014  Time of notification:  2240   Critical value read back:Yes.    Nurse who received alert:  Lionel December, RN  MD notified (1st page):  Dr Vania Rea  Time of first page:  2241- made aware while in the unit  MD notified (2nd page):  Time of second page:  Responding MD:  Dr Vania Rea  Time MD responded:  [phone]

## 2014-11-05 NOTE — Progress Notes (Signed)
Gave report to nurse working with Shanda Bumps. This is an Printmaker. Obtained all medicines and paperwork I had and transferred to 304 with aid of Tresa Endo CNA and son of patient.

## 2014-11-05 NOTE — Progress Notes (Signed)
[facility] PROGRESS NOTE  Brandy Webb ZOX:[id number] DOB: Jun 16, 1918    PCP:   Rosealee Albee, MD   HPI: 81 yo female admitted with likely septic shock, severe CHF with valvular disease, hypotensive and in pulmonary edema, hypothermic and barely responsive, cyanotic, and was gravely ill. Spoken with her son HCP, who agreed that she is not a candidate for aggressive treatments, and she was subsequently admitted into the ICU for IV antibiotics, IV fluid or diuretics depending on volume status, and supplemtal oxygen, but no intubation, pressor, or cardiac resuscitation. Family is aware that she is gravely ill and may not make it through this admission.  She has had OK days and bad days, but she is not really getting any better. Her BP has been stable but on the low side. She is very fragil.She was given higher oxygen level, and getting hypotensive and tachycardic.  Rewiew of Systems:  Constitutional: Negative for malaise, fever and chills. No significant weight loss or weight gain Eyes: Negative for eye pain, redness and discharge, diplopia, visual changes, or flashes of light. ENMT: Negative for ear pain, hoarseness, nasal congestion, sinus pressure and sore throat. No headaches; tinnitus, drooling, or problem swallowing. Cardiovascular: Negative for chest pain, palpitations, diaphoresis,  and peripheral edema. ; No orthopnea, PND Respiratory: Negative for cough, hemoptysis, wheezing and stridor. No pleuritic chestpain. Gastrointestinal: Negative for nausea, vomiting, diarrhea, constipation, abdominal pain, melena, blood in stool, hematemesis, jaundice and rectal bleeding.    Genitourinary: Negative for frequency, dysuria, incontinence,flank pain and hematuria; Musculoskeletal: Negative for back pain and neck pain. Negative for swelling and trauma.;  Skin: . Negative for pruritus, rash, abrasions, bruising and skin lesion.; ulcerations Neuro: Negative for headache, lightheadedness  and neck stiffness. Negative for weakness, altered level of consciousness , altered mental status, extremity weakness, burning feet, involuntary movement, seizure and syncope.  Psych: negative for anxiety, depression, insomnia, tearfulness, panic attacks, hallucinations, paranoia, suicidal or homicidal ideation    Past Medical History  Diagnosis Date  . Essential hypertension, benign   . Chronic atrial fibrillation   . Mixed hyperlipidemia   . Spinal stenosis   . Peripheral neuropathy   . Insomnia   . Mitral regurgitation     Moderate to severe  . Tricuspid regurgitation     Severe  . Secondary pulmonary hypertension     Moderate  . Non-ST elevation MI (NSTEMI)     May 2013 - type 2 event managed medically  . TIA (transient ischemic attack)   . Thyroid disease     Past Surgical History  Procedure Laterality Date  . Tonsillectomy    . Dilation and curettage of uterus    . Replacement total knee bilateral      Medications:  HOME MEDS: Prior to Admission medications   Medication Sig Start Date End Date Taking? Authorizing Provider  ALPRAZolam (XANAX) 0.25 MG tablet Take 0.25 mg by mouth at bedtime as needed.  03/24/12  Yes Historical Provider, MD  atenolol (TENORMIN) 25 MG tablet Take 25 mg in the am, and 50 mg in the pm 08/02/14  Yes Jonelle Sidle, MD  Carboxymethylcellul-Glycerin (OPTIVE) 0.5-0.9 % SOLN Place 1 drop into the right eye 4 (four) times daily.   Yes Historical Provider, MD  clindamycin (CLEOCIN) 300 MG capsule Take 450 mg by mouth. Dental procedures   Yes Historical Provider, MD  ELIQUIS 2.5 MG TABS tablet TAKE ONE TABLET BY MOUTH TWICE DAILY 02/14/14  Yes Jonelle Sidle, MD  furosemide (LASIX) 20 MG  tablet Take 1 tablet (20 mg total) by mouth 2 (two) times daily. Patient taking differently: Take 40 mg by mouth 2 (two) times daily.  07/08/14  Yes Jonelle Sidle, MD  levothyroxine (SYNTHROID, LEVOTHROID) 150 MCG tablet Take 150 mcg by mouth daily.     Yes  Historical Provider, MD  omeprazole (PRILOSEC) 20 MG capsule Take 20 mg by mouth 2 (two) times daily before a meal.  06/17/14 06/17/15 Yes Historical Provider, MD  traMADol (ULTRAM) 50 MG tablet Take 50 mg by mouth every 6 (six) hours as needed.  10/18/12  Yes Historical Provider, MD  traZODone (DESYREL) 50 MG tablet May take 1/2 tab ( ) nightly as needed for sleep, may increase to 1 tab ( ) if necessary Patient taking differently: Take 50 mg by mouth at bedtime as needed.  05/26/11  Yes June Leap, MD     Allergies:  No Known Allergies  Social History:   reports that she has never smoked. She has never used smokeless tobacco. She reports that she does not drink alcohol or use illicit drugs.  Family History: History reviewed. No pertinent family history.   Physical Exam: Filed Vitals:   11/05/14 0700 11/05/14 0743 11/05/14 0758 11/05/14 0800  BP: 95/71   88/56  Pulse: 30     Temp:  98.5 F (36.9 C)    TempSrc:  Axillary    Resp: 26   40  Height:      Weight:      SpO2: 81%  93%    Blood pressure 88/56, pulse 30, temperature 98.5 F (36.9 C), temperature source Axillary, resp. rate 40, height  (1.676 m), weight 78.3 kg (172 lb 9.9 oz), SpO2 93 %.  GEN:  Pleasant  patient lying in the stretcher in no acute distress; cooperative with exam. PSYCH:  alert and oriented x4; does not appear anxious or depressed; affect is appropriate. HEENT: Mucous membranes pink and anicteric; PERRLA; EOM intact; no cervical lymphadenopathy nor thyromegaly or carotid bruit; no JVD; There were no stridor. Neck is very supple. Breasts:: Not examined CHEST WALL: No tenderness CHEST: Normal respiration, clear to auscultation bilaterally.  HEART: Regular rate and rhythm.  There are no murmur, rub, or gallops.   BACK: No kyphosis or scoliosis; no CVA tenderness ABDOMEN: soft and non-tender; no masses, no organomegaly, normal abdominal bowel sounds; no pannus; no intertriginous candida. There is  no rebound and no distention. Rectal Exam: Not done EXTREMITIES: No bone or joint deformity; age-appropriate arthropathy of the hands and knees; no edema; no ulcerations.  There is no calf tenderness. Genitalia: not examined PULSES: 2+ and symmetric SKIN: Normal hydration no rash or ulceration CNS: Cranial nerves 2-12 grossly intact no focal lateralizing neurologic deficit.  Speech is fluent; uvula elevated with phonation, facial symmetry and tongue midline. DTR are normal bilaterally, cerebella exam is intact, barbinski is negative and strengths are equaled bilaterally.  No sensory loss.   Labs on Admission:  Basic Metabolic Panel:  Recent Labs Lab 10/31/14 0928 10/31/14 0938 11/01/14 0404 11/02/14 0442 11/03/14 0530 11/04/14 0504  NA 144 144 147* 143 145 143  K 3.3* 3.3* 3.8 3.9 3.6 3.3*  CL 103 100* 106 107 105 105  CO2 32  --  GLUCOSE 111* 108* 81 119* 114* 111*  BUN 44* 40* 45* 44* 46* 47*  CREATININE 1.51* 1.50* 1.58* 1.64* 1.71* 1.72*  CALCIUM 8.5*  --  8.6* 8.3* 8.2* 8.2*  MG 2.5*  --   --   --   --   --  Liver Function Tests:  Recent Labs Lab 10/31/14 0928 11/01/14 0404  AST 22 21  ALT 10* 10*  ALKPHOS 69 61  BILITOT 2.0* 1.9*  PROT 7.2 6.5  ALBUMIN 3.2* 2.9*   CBC:  Recent Labs Lab 10/31/14 0928 10/31/14 0938 11/01/14 0404 11/02/14 0442  WBC 7.2  --  7.0 7.8  NEUTROABS 4.9  --   --   --   HGB 14.9 16.3* 14.4 14.4  HCT 46.8* 48.0* 45.3 45.1  MCV 96.9  --  97.8 96.8  PLT 179  --  200 181   Cardiac Enzymes:  Recent Labs Lab 10/31/14 0928 10/31/14 1812 11/01/14 0404  TROPONINI 0.03 0.03 <0.03   Assessment/Plan Present on Admission:  . Acute on chronic diastolic heart failure (HCC) . Chronic atrial fibrillation (HCC) . Acute kidney injury (HCC) . A-fib (HCC) . Hypothermia . Hypotension . Pleural effusion . Acute encephalopathy . Acute on chronic diastolic HF (heart failure) (HCC)  PLAN:  Acute on chronic diastolic heart  failure:  Her condition remaisns very guarded. Currently on low dose IV Lasix BID. WIll continue.  Decision has been to not accelerate her care, and provide palliative care. Family is aware that she is likely to decompensate quickly and unexpectedly, and morphine drip should be started at the appropriate time.  For now, I will transfer her to the floor, and use PRN IV Morphine for her comfort.    Sepsis:  She has been on broad spectrum IV antibiotics.  Now her BC was positive for GNR.  Will continue with current antibiotics.   Hypotension: likely related to above. SBP have improved to 100's, but went down again to high 80's. Discussed situation with family.   Acute kidney injury: Creatinine on admission 1.5. Now 1.7. She will need to be on the low volume status. This is significantly higher than several months ago. May be related to #2 in the setting of recently increased Lasix due to worsening CHF. Holding any nephrotoxins.  Acute encephalopathy: Likely related to above in the setting of possible infectious process. Will continue Vanco and Zosyn day #2. Slightly more responsive this am. Of note her lactic acid normalized at the point of admission. CT without any acute abnormality. She has improved and conversed now.    Pleural effusion: Per chest x-ray see above   Chronic atrial fibrillation: Home medications include atenolol and Eliqis Holding atenolol secondary to #2 patient is not able to take oral medications at this point anyhow. Provide Lovenox   Other plans as per orders.  Code Status:  DNR.    Houston Siren, MD. [facility] Pager [phone] 7pm to 7am.  11/05/2014, 9:46 AM

## 2014-11-05 NOTE — Progress Notes (Signed)
Patient's son stated "due to the amount of time we were told she has left," he request vitals with SpO2 check be done more frequently than Q4 . SpO2  Of 96% was obtained with disposal probe on forehead. Heating packs wrapped on pillow cases where also placed under both hands in an effort to obtain SpO2 on finger.

## 2014-11-05 NOTE — Clinical Social Work Note (Addendum)
Clinical Social Work Assessment  Patient Details  Name: Brandy Webb MRN: [id number] Date of Birth: 28-Dec-1918  Date of referral:  11/05/14               Reason for consult:  Facility Placement                Permission sought to share information with:    Permission granted to share information::     Name::        Agency::     Relationship::     Contact Information:     Housing/Transportation Living arrangements for the past 2 months:  [place] of Information:  Adult Children Patient Interpreter Needed:  None Criminal Activity/Legal Involvement Pertinent to Current Situation/Hospitalization:  No - Comment as needed Significant Relationships:  Adult Children Lives with:  Facility Resident Do you feel safe going back to the place where you live?  Yes Need for family participation in patient care:  Yes (Comment)  Care giving concerns: Facility Resident.    Social Worker assessment / plan:  CSW met with patient, whose son, Zurri Rudden, was at bedside.  He advised that patient had been a resident at [facility] since June, 2016. Mr. Akkerman indicated that patient uses a rollator walker for ambulation. He indicated that patient receives assistance with dressing and bathing from facility staff. He advised that he lives out of town but comes every other week to visit with patient.  CSW provided supportive counseling to patient's son related to patient's overall illness.  CSW spoke with Tammy at [facility]. Tammy confirmed Mr. Ottaviano statements.  She stated that patient was very independent up until the last two weeks. She advised due to patient being gravely ill, she would have to reassess her if she improved enough to be released from the hospital to address return to the facility issues.   Employment status:  Retired Forensic scientist:  Medicare PT Recommendations:  Not assessed at this time Information / Referral to community resources:      Patient/Family's Response to care: Family did not mention patient's return to the facility due to patient currently being so ill.    Patient/Family's Understanding of and Emotional Response to Diagnosis, Current Treatment, and Prognosis:  Mr. addasyn mcbreen understanding of patient diagnosis, treatment and prognosis.    Emotional Assessment Appearance:  Appears stated age Attitude/Demeanor/Rapport:  Unable to Assess Affect (typically observed):  Unable to Assess Orientation:    Alcohol / Substance use:  Not Applicable Psych involvement (Current and /or in the community):  No (Comment)  Discharge Needs  Concerns to be addressed:  Discharge Planning Concerns Readmission within the last 30 days:  No Current discharge risk:  None Barriers to Discharge:  No Barriers Identified   Ihor Gully, LCSW 11/05/2014, 11:39 AM [phone]

## 2014-11-06 DIAGNOSIS — I4891 Unspecified atrial fibrillation: Secondary | ICD-10-CM

## 2014-11-06 LAB — BASIC METABOLIC PANEL
Anion gap: 13 (ref 5–15)
BUN: 60 mg/dL — AB (ref 6–20)
CHLORIDE: 103 mmol/L (ref 101–111)
CO2: 29 mmol/L (ref 22–32)
CREATININE: 2.79 mg/dL — AB (ref 0.44–1.00)
Calcium: 8.3 mg/dL — ABNORMAL LOW (ref 8.9–10.3)
GFR calc non Af Amer: 14 mL/min — ABNORMAL LOW (ref >60)
GFR, EST AFRICAN AMERICAN: 16 mL/min — AB (ref >60)
Glucose, Bld: 111 mg/dL — ABNORMAL HIGH (ref 65–99)
POTASSIUM: 3.6 mmol/L (ref 3.5–5.1)
SODIUM: 145 mmol/L (ref 135–145)

## 2014-11-06 LAB — CULTURE, BLOOD (ROUTINE X 2)

## 2014-11-06 MED ORDER — ONDANSETRON HCL 4 MG PO TABS: 4.0000 mg | ORAL_TABLET | Freq: Four times a day (QID) | ORAL | Status: AC | PRN

## 2014-11-06 MED ORDER — LORAZEPAM 0.5 MG PO TABS: 0.5000 mg | ORAL_TABLET | Freq: Three times a day (TID) | ORAL | Status: DC | PRN

## 2014-11-06 MED ORDER — MORPHINE SULFATE (CONCENTRATE) 10 MG /0.5 ML PO SOLN: 10.0000 mg | ORAL | Status: DC | PRN

## 2014-11-06 NOTE — Clinical Social Work Note (Signed)
CSW met with pt's son, Ronalee Belts at bedside following MD discussion. Family is open to hospice consult with possibility of transfer to [place]. Support provided. CSW sent referral and will notify family when hospice can meet.   Benay Pike, [place]

## 2014-11-06 NOTE — Progress Notes (Signed)
[facility] PROGRESS NOTE  Brandy Webb MVH:[id number] DOB: 05/09/1918 DOA: 10/31/2014 PCP: Rosealee Albee, MD  Assessment/Plan: 1. Sepsis. Patient was started on IV Vancomycin and Zosyn. She has since been afebrile and does not have any leukocytosis. CXR did not any indicate any PNA. UC did not show any growth. BC show 1/2+ for GPR which may be a contaminant. Abx have since been discontinued. 2. Acute on chronic diastolic heart failure.  Patient is currently on low dosage IV  Lasix. Intake/output has not been accurately charted. Creatinine continues to rise. Wil discontinue further lasix. She continues to be SOB on Venti mask 3. Acute repsiratory failure with hypoxia likely related to CHF. Currently on Venti mask. Continues supportive measures. 4. AKI. Creatinine/BUN has continue to trend up at 2.79/60. Will discontinue IV lasix. 5. Chronic atrial fibrillation. Will discontinue anticoagulation. HR remains uncontrolled. She cannot take any oral medications. 6. Acute encephalopathy likely metabolic. She remains somnolent and confused. CT head on admission was negative. 7. Discussion. Patients hospital course, current state of health and expected prognosis were discussed with the patients family. They understand that her prognosis is poor and that she may not survive this illness. They have agreed to meet with hospice services. I think she will be a good candidate of residential hospice. They wish to focus her care on her comfort and quality of life.  Code Status: DNR DVT Prophylaxis Lovenox Family discussion: Discussed plan in detail with family and patient. No further concerns at this time. Disposition Plan: Anticipate discharge to hospice care    Consultants:    Procedures:    Antibiotics:    HPI/Subjective: Patient was asleep but awoke to light touch. Denies cough. She tolerated half a bowl of oatmeal today per family. No dysphagia  Objective: Filed Vitals:   11/06/14  0636  BP: 99/66  Pulse: 79  Temp: 97.7 F (36.5 C)  Resp: 33    Intake/Output Summary (Last 24 hours) at 11/06/14 1248 Last data filed at 11/06/14 0855  Gross per 24 hour  Intake    210 ml  Output      0 ml  Net    210 ml   Filed Weights   11/04/14 0500 11/05/14 0400 11/05/14 1505  Weight: 78.3 kg (172 lb 9.9 oz) 78.3 kg (172 lb 9.9 oz) 79.334 kg (174 lb 14.4 oz)    Exam:  General:  Appears somnolent, difficult to arouse.  Cardiovascular: Irregular rhythm and tachycardic, no murmur, rub or gallop. No lower extremity edema. Telemetry: Sinus rhythm, no arrhythmias  Respiratory: Clear to auscultation bilaterally, no wheezes, rales or rhonchi. Normal respiratory effort.  Data Reviewed: Basic Metabolic Panel:  Recent Labs Lab 10/31/14 0928  11/01/14 0404 11/02/14 0442 11/03/14 0530 11/04/14 0504 11/06/14 0516  NA 144  < > 147* 143 145 143 145  K 3.3*  < > 3.8 3.9 3.6 3.3* 3.6  CL 103  < > 106 107 105 105 103  CO2 32  --  GLUCOSE 111*  < > 81 119* 114* 111* 111*  BUN 44*  < > 45* 44* 46* 47* 60*  CREATININE 1.51*  < > 1.58* 1.64* 1.71* 1.72* 2.79*  CALCIUM 8.5*  --  8.6* 8.3* 8.2* 8.2* 8.3*  MG 2.5*  --   --   --   --   --   --   < > = values in this interval not displayed. Liver Function Tests:  Recent Labs Lab 10/31/14 [phone]  11/01/14 0404  AST 22 21  ALT 10* 10*  ALKPHOS 69 61  BILITOT 2.0* 1.9*  PROT 7.2 6.5  ALBUMIN 3.2* 2.9*  CBC:  Recent Labs Lab 10/31/14 0928 10/31/14 0938 11/01/14 0404 11/02/14 0442  WBC 7.2  --  7.0 7.8  NEUTROABS 4.9  --   --   --   HGB 14.9 16.3* 14.4 14.4  HCT 46.8* 48.0* 45.3 45.1  MCV 96.9  --  97.8 96.8  PLT 179  --  200 181   Cardiac Enzymes:  Recent Labs Lab 10/31/14 0928 10/31/14 1812 11/01/14 0404  TROPONINI 0.03 0.03 <0.03   BNP (last 3 results)  Recent Labs  10/31/14 0928  BNP 712.0*     Recent Results (from the past 240 hour(s))  Blood Culture (routine x 2)     Status: None    Collection Time: 10/31/14  9:28 AM  Result Value Ref Range Status   Specimen Description BLOOD RIGHT ANTECUBITAL  Final   Special Requests   Final    BOTTLES DRAWN AEROBIC AND ANAEROBIC AEB=4CC ANA=3CC   Culture NO GROWTH 5 DAYS  Final   Report Status 11/05/2014 FINAL  Final  Blood Culture (routine x 2)     Status: None   Collection Time: 10/31/14  9:55 AM  Result Value Ref Range Status   Specimen Description BLOOD IV DRAW DRAWN BY RN  Final   Special Requests BOTTLES DRAWN AEROBIC AND ANAEROBIC 5CC EACH  Final   Culture  Setup Time   Final    GRAM POSITIVE RODS RECOVERED FROM THE AEROBIC BOTTLE Gram Stain Report Called to,Read Back By and Verified With: HAMMOCK,S AT 2235 ON 11/04/2014 BY LOY,C. Performed at [facility]    Culture   Final    DIPHTHEROIDS(CORYNEBACTERIUM SPECIES) Standardized susceptibility testing for this organism is not available. Performed at [facility]    Report Status 11/06/2014 FINAL  Final  Urine culture     Status: None   Collection Time: 10/31/14 10:30 AM  Result Value Ref Range Status   Specimen Description URINE, CATHETERIZED  Final   Special Requests NONE  Final   Culture   Final    NO GROWTH 1 DAY Performed at [facility]    Report Status 11/01/2014 FINAL  Final  MRSA PCR Screening     Status: None   Collection Time: 10/31/14  6:09 PM  Result Value Ref Range Status   MRSA by PCR NEGATIVE NEGATIVE Final    Comment:        The GeneXpert MRSA Assay (FDA approved for NASAL specimens only), is one component of a comprehensive MRSA colonization surveillance program. It is not intended to diagnose MRSA infection nor to guide or monitor treatment for MRSA infections.      Studies: No results found.  Scheduled Meds: . antiseptic oral rinse  7 mL Mouth Rinse q12n4p  . docusate sodium  100 mg Oral BID  . enoxaparin (LOVENOX) injection  80 mg Subcutaneous Q24H  . feeding supplement (ENSURE ENLIVE)  237 mL Oral BID BM   . furosemide  20 mg Intravenous Q12H  . hydroxypropyl methylcellulose / hypromellose  1 drop Right Eye QID  . levothyroxine  150 mcg Oral QAC breakfast  . multivitamin with minerals  1 tablet Oral Daily   Continuous Infusions: . sodium chloride 10 mL/hr at 11/05/14 0600    Principal Problem:   Acute on chronic diastolic heart failure (HCC) Active Problems:   Chronic atrial  fibrillation (HCC)   Acute kidney injury (HCC)   A-fib (HCC)   Hypothermia   Hypotension   Pleural effusion   Acute encephalopathy   Acute on chronic diastolic HF (heart failure) (HCC)   Protein-calorie malnutrition, severe (HCC)    Time spent: 25 minutes  By signing my name below, I, Edman Circle attest that this documentation has been prepared under the direction and in the presence of Erick Blinks, MD  Electronically signed: Edman Circle  11/06/2014 12:52 PM   Erick Blinks, M.D.  [facility] Pager [phone]. If 7PM-7AM, please contact night-coverage at [email or web address], password [facility] 11/06/2014, 12:48 PM  LOS: 6 days     I, Dr. Erick Blinks, personally performed the services described in this documentaiton. All medical record entries made by the scribe were at my direction and in my presence. I have reviewed the chart and agree that the record reflects my personal performance and is accurate and complete  Erick Blinks, MD, 11/06/2014 1:27 PM

## 2014-11-06 NOTE — Consult Note (Signed)
WOC reviewed chart. Patient has been transferred to the floor and the family has requests comfort care/palliative care.  Davina Poke RN,CWOCN

## 2014-11-06 NOTE — Care Management Note (Signed)
Case Management Note  Patient Details  Name: Brandy Webb MRN: [id number] Date of Birth: July 03, 1918  Expected Discharge Date:     11/06/2014             Expected Discharge Plan:  Hospice Medical Facility  In-House Referral:  Clinical Social Work  Discharge planning Services  CM Consult  Post Acute Care Choice:  NA Choice offered to:  NA  DME Arranged:    DME Agency:     HH Arranged:    HH Agency:     Status of Service:  In process, will continue to follow  Medicare Important Message Given:  Yes-second notification given Date Medicare IM Given:    Medicare IM give by:    Date Additional Medicare IM Given:    Additional Medicare Important Message give by:     If discussed at Long Length of Stay Meetings, dates discussed:    Additional Comments: Pt's family agreeable to hospice. Pt will require hospice facility placement. CSW made aware of referral and will contact [facility] to determine bed availability and request meeting with family. Anticipate DC to Hospice facility on 11/28/2014 or GIP.  Malcolm Metro, RN 11/06/2014, 2:43 PM

## 2014-11-07 ENCOUNTER — Ambulatory Visit: Payer: Medicare Other | Admitting: Cardiology

## 2014-11-07 DIAGNOSIS — A419 Sepsis, unspecified organism: Principal | ICD-10-CM

## 2014-11-07 DIAGNOSIS — E43 Unspecified severe protein-calorie malnutrition: Secondary | ICD-10-CM

## 2014-11-07 DIAGNOSIS — G9340 Encephalopathy, unspecified: Secondary | ICD-10-CM

## 2014-11-07 DIAGNOSIS — N179 Acute kidney failure, unspecified: Secondary | ICD-10-CM

## 2014-11-07 MED ORDER — MORPHINE SULFATE (CONCENTRATE) 10 MG /0.5 ML PO SOLN: 5.0000 mg | ORAL | Status: AC | PRN

## 2014-11-07 MED ORDER — TRAZODONE HCL 50 MG PO TABS: 25.0000 mg | ORAL_TABLET | Freq: Every evening | ORAL | Status: AC | PRN

## 2014-11-07 MED ORDER — LORAZEPAM 0.5 MG PO TABS: 0.5000 mg | ORAL_TABLET | Freq: Three times a day (TID) | ORAL | Status: AC | PRN

## 2014-11-07 NOTE — Consult Note (Signed)
WOC reviewed chart. Plans for DC to hospice house today.  Spoke with bedside nurse, agreed that patient does not really need compression wraps if she is going to be bedbound and have legs elevated.  Also patient does not have any significant ulcers.  Suggest DC of compression wraps at this time.  If family agreeable.  If not will have WTA (wound treatment associate) on the unit change wraps prior to DC today.  Davina Poke RN, CWOCN [phone]

## 2014-11-07 NOTE — Clinical Social Work Note (Signed)
CSW spoke with [place] at [facility] [place]. Jenny Reichmann confirmed that there was a bed available for patient.   CSW met with patient's son and daughter who were agreeable for patient to be transported to Hospice.   CSW facilitated discharge.  CSW arranged patient transport to [facility].  Ihor Gully, [place] [phone]

## 2014-11-07 NOTE — Care Management Note (Addendum)
Case Management Note  Patient Details  Name: Brandy Webb MRN: [id number] Date of Birth: 1918/08/05  Expected Discharge Date:                  Expected Discharge Plan:  Hospice Medical Facility  In-House Referral:  Clinical Social Work  Discharge planning Services  CM Consult  Post Acute Care Choice:  NA Choice offered to:  NA  DME Arranged:    DME Agency:     HH Arranged:    HH Agency:     Status of Service:  Completed, signed off  Medicare Important Message Given:  Yes-third notification given Date Medicare IM Given:    Medicare IM give by:    Date Additional Medicare IM Given:    Additional Medicare Important Message give by:     If discussed at Long Length of Stay Meetings, dates discussed:  11/22/2014  Additional Comments: Pt discharging to [facility] hospice facility today. Pt RN and family aware of DC arrangements. No further CM needs identified. Malcolm Metro, RN 11/05/2014, 11:46 AM

## 2014-11-07 NOTE — Discharge Summary (Addendum)
Physician Discharge Summary  Brandy Webb JXB:[id number] DOB: [birth year] DOA: 10/31/2014  PCP: Rosealee Albee, MD  Admit date: 10/31/2014 Discharge date: 11/04/2014  Transfer to residential hospice  Discharge Diagnoses:  1. Presumed sepsis with hypothermia, hypotension, AKI, acute encephalopathy, lactic acidosis on admission. 2. Acute on chronic diastolic heart failure NYHA class III. 3. Acute respiratory failure with hypoxia likely related to CHF. 4. AKI 5. Acute encephalopathy likely multifactorial including hypoxia, metabolic. 6. Chronic atrial fibrillation.  7. Valvular heart disease including moderate to severe mitral regurgitation and severe tricuspid regurgitation. 8. Moderate pulmonary hypertension  9. CAD, s/p NSTEMI 06/2011 10. LE venous stasis disease with one small ulcer left upper lateral calf. 11. Severe malnutrition in context of chronic illness   Discharge Condition: Terminal, days Disposition: Hospice care  Diet recommendation:   Filed Weights   11/05/14 0400 11/05/14 1505 11/25/2014 0519  Weight: 78.3 kg (172 lb 9.9 oz) 79.334 kg (174 lb 14.4 oz) 83.734 kg (184 lb 9.6 oz)    History of present illness:  89 yow PMH diastolic CHF NYHA III, atrial fibrillation, valvular heart disease, moderate pulm HTN, CAD presented to ED with SOB and confusion. Admitted for sepsis with hypothermia and hypotension, AKI, acute diastolic CHF, acute encephalopathy.   Hospital Course:  Sepsis with hypothermia and hypotension, AKI, acute diastolic CHF, acute encephalopathy was treated with broad spectrum antibiotics and seen in consultation with cardiology. Temperature normalized but patient remained hypotensive, hypoxic and no source of infection was localized. 1/2 GPC in blood contaminant. Condition worsened with increased oxygen requirement and worsening AKI. Management of CHF and valvular disease was limited by hypotension. Despite treatment patient remained encephalopathic and  unable to take consisttent PO. Dr. Kerry Hough discussed with son Kathlene November and patient was referred to hospice, antibiotics were stopped and plans were made to focus on comfort.   Individual issues as below:   1. Presumed sepsis with hypothermia, hypotension, AKI, acute encephalopathy, lactic acidosis on admission. No source identified. Currently afebrile but remains hypoxic. CXR showed interstitial edema and UC was unremarkable. BC show 1/2+ for diphtheroids, contaminant.  2. Acute on chronic diastolic heart failure NYHA class III.treatment complicated by valvular disease and hypotension. I/O balanced.  3. Acute respiratory failure with hypoxia likely related to CHF. O2 sat in the low 80s on Venti Mask.  4. AKI, worsening secondary to cardiorenal syndrome, FTT 5. Acute encephalopathy, likely secondary to hypoxia, CT head remote infarct. Son reports stroke symptoms several years ago. No further evaluation suggestion. 6. Chronic atrial fibrillation, on Eliquis on admission.  7. Valvular heart disease including moderate to severe mitral regurgitation and severe tricuspid regurgitation. Following conservatively.  8. Moderate pulmonary hypertension  9. CAD, s/p NSTEMI 06/2011 10. LE venous stasis disease with one small ulcer left upper lateral calf. Silicone foam to the LLE ulcer. WOC placed bilateral 4 layer compression wraps 10/2.Recommend change again on 10/6 11. Severe malnutrition in context of chronic illness 12. Discussion.   Hypoxic, hypotensive and tachycardic but appears stable. Review of record shows hypoxia and hypotension, tachycardia without significant change last 72 hours. Prognosis is days, recommend transfer to residential hospice.   Long discussion with son Kathlene November, daughter and son-in-law at bedside. Reviewed all testing and reviewed decision for comfort care 10/5. Family confirms comfort care and minimization of meds. Understanding that death may occur at any time but agree  with transfer.  Consultants:  Cardiology  Procedures:  Echo Study Conclusions  - Left ventricle: The cavity size was normal. Wall thickness was  normal. Systolic function was normal. The estimated ejection fraction was in the range of 55% to 60%. Wall motion was normal; there were no regional wall motion abnormalities. The study was not technically sufficient to allow evaluation of LV diastolic dysfunction due to atrial fibrillation. - Aortic valve: Mildly calcified annulus. Trileaflet; mildly thickened leaflets. Valve area (VTI): 2.14 cm^2. Valve area (Vmax): 2.06 cm^2. - Mitral valve: Mildly calcified annulus. Moderately thickened leaflets . The MR jet is eccentric and posterior, this may lead to underestimation of severity. There was moderate regurgitation. - Left atrium: The atrium was severely dilated. - Right ventricle: The ventricular septum is flattened in systole and diastole consistent with RV pressure and volume overload. The cavity size was moderately to severely dilated. The RV shares the apex with the LV. Systolic function was mildly reduced. TAPSE: 14 mm . - Right atrium: The atrium was massively dilated. - Tricuspid valve: There was moderate-severe regurgitation. - Pulmonary arteries: Systolic pressure was moderately increased. PA peak pressure: 65 mm Hg (S). - Inferior vena cava: The vessel was dilated. The respirophasic diameter changes were blunted (< 50%), consistent with elevated central venous pressure.  Antibiotics:  Vancomycin 9/29>>10/5  Zosyn 9/29>>10/5    Discharge Instructions   Current Discharge Medication List    START taking these medications   Details  LORazepam (ATIVAN) 0.5 MG tablet Place 1 tablet (0.5 mg total) under the tongue every 8 (eight) hours as needed for anxiety. Qty: 30 tablet, Refills: 0    Morphine Sulfate (MORPHINE CONCENTRATE) 10 mg / 0.5 ml concentrated solution Place 0.25 mLs (5 mg  total) under the tongue every 2 (two) hours as needed for severe pain. Qty: 240 mL, Refills: 0    ondansetron (ZOFRAN) 4 MG tablet Take 1 tablet (4 mg total) by mouth every 6 (six) hours as needed for nausea. Qty: 20 tablet, Refills: 0      CONTINUE these medications which have CHANGED   Details  traZODone (DESYREL) 50 MG tablet Take 0.5 tablets (25 mg total) by mouth at bedtime as needed for sleep.      STOP taking these medications     ALPRAZolam (XANAX) 0.25 MG tablet      atenolol (TENORMIN) 25 MG tablet      Carboxymethylcellul-Glycerin (OPTIVE) 0.5-0.9 % SOLN      clindamycin (CLEOCIN) 300 MG capsule      ELIQUIS 2.5 MG TABS tablet      furosemide (LASIX) 20 MG tablet      levothyroxine (SYNTHROID, LEVOTHROID) 150 MCG tablet      omeprazole (PRILOSEC) 20 MG capsule      traMADol (ULTRAM) 50 MG tablet        No Known Allergies  The results of significant diagnostics from this hospitalization (including imaging, microbiology, ancillary and laboratory) are listed below for reference.    Significant Diagnostic Studies: Ct Head Wo Contrast  10/31/2014   CLINICAL DATA:  Patient unresponsive this morning. Initial encounter.  EXAM: CT HEAD WITHOUT CONTRAST  TECHNIQUE: Contiguous axial images were obtained from the base of the skull through the vertex without intravenous contrast.  COMPARISON:  None.  FINDINGS: The patient has a small infarct in the right parietal lobe. Although its chronicity cannot be definitively determined, it appears subacute to remote. There is some cortical atrophy. No hemorrhage, mass lesion, midline shift or abnormal extra-axial fluid collection. No hydrocephalus or pneumocephalus. The calvarium is intact. Imaged paranasal sinuses and mastoid air cells are clear.  IMPRESSION: Small infarct in the  right parietal lobe appears remote but its chronicity cannot be definitively determined.  Mild atrophy.   Electronically Signed   By: Drusilla Kanner M.D.    On: 10/31/2014 11:36   Dg Chest Port 1 View  10/31/2014   CLINICAL DATA:  Shortness of breath today. History of congestive heart failure.  EXAM: PORTABLE CHEST 1 VIEW  COMPARISON:  None.  FINDINGS: There is marked cardiomegaly and mild interstitial edema. Small bilateral pleural effusions are seen. No pneumothorax is identified. Marked degenerative change is seen about the shoulders.  IMPRESSION: Cardiomegaly and interstitial edema with associated small bilateral pleural effusions.   Electronically Signed   By: Drusilla Kanner M.D.   On: 10/31/2014 09:42    Microbiology: Recent Results (from the past 240 hour(s))  Blood Culture (routine x 2)     Status: None   Collection Time: 10/31/14  9:28 AM  Result Value Ref Range Status   Specimen Description BLOOD RIGHT ANTECUBITAL  Final   Special Requests   Final    BOTTLES DRAWN AEROBIC AND ANAEROBIC AEB=4CC ANA=3CC   Culture NO GROWTH 5 DAYS  Final   Report Status 11/05/2014 FINAL  Final  Blood Culture (routine x 2)     Status: None   Collection Time: 10/31/14  9:55 AM  Result Value Ref Range Status   Specimen Description BLOOD IV DRAW DRAWN BY RN  Final   Special Requests BOTTLES DRAWN AEROBIC AND ANAEROBIC 5CC EACH  Final   Culture  Setup Time   Final    GRAM POSITIVE RODS RECOVERED FROM THE AEROBIC BOTTLE Gram Stain Report Called to,Read Back By and Verified With: HAMMOCK,S AT 2235 ON 11/04/2014 BY LOY,C. Performed at [facility]    Culture   Final    DIPHTHEROIDS(CORYNEBACTERIUM SPECIES) Standardized susceptibility testing for this organism is not available. Performed at [facility]    Report Status 11/06/2014 FINAL  Final  Urine culture     Status: None   Collection Time: 10/31/14 10:30 AM  Result Value Ref Range Status   Specimen Description URINE, CATHETERIZED  Final   Special Requests NONE  Final   Culture   Final    NO GROWTH 1 DAY Performed at [facility]    Report Status 11/01/2014 FINAL  Final    MRSA PCR Screening     Status: None   Collection Time: 10/31/14  6:09 PM  Result Value Ref Range Status   MRSA by PCR NEGATIVE NEGATIVE Final    Comment:        The GeneXpert MRSA Assay (FDA approved for NASAL specimens only), is one component of a comprehensive MRSA colonization surveillance program. It is not intended to diagnose MRSA infection nor to guide or monitor treatment for MRSA infections.      Labs: Basic Metabolic Panel:  Recent Labs Lab 11/01/14 0404 11/02/14 0442 11/03/14 0530 11/04/14 0504 11/06/14 0516  NA 147* 143 145 143 145  K 3.8 3.9 3.6 3.3* 3.6  CL 106 107 105 105 103  CO2 GLUCOSE 81 119* 114* 111* 111*  BUN 45* 44* 46* 47* 60*  CREATININE 1.58* 1.64* 1.71* 1.72* 2.79*  CALCIUM 8.6* 8.3* 8.2* 8.2* 8.3*   Liver Function Tests:  Recent Labs Lab 11/01/14 0404  AST 21  ALT 10*  ALKPHOS 61  BILITOT 1.9*  PROT 6.5  ALBUMIN 2.9*    CBC:  Recent Labs Lab 11/01/14 0404 11/02/14 0442  WBC 7.0 7.8  HGB 14.4 14.4  HCT 45.3 45.1  MCV 97.8 96.8  PLT 200 181   Cardiac Enzymes:  Recent Labs Lab 10/31/14 1812 11/01/14 0404  TROPONINI 0.03 <0.03       Recent Labs  10/31/14 0928  BNP 712.0*      Principal Problem:   Acute on chronic diastolic heart failure (HCC) Active Problems:   Chronic atrial fibrillation (HCC)   Acute kidney injury (HCC)   A-fib (HCC)   Hypothermia   Hypotension   Pleural effusion   Acute encephalopathy   Acute on chronic diastolic HF (heart failure) (HCC)   Protein-calorie malnutrition, severe (HCC)   Time coordinating discharge: 50 minutes  Signed:  [name], MD [facility] 11/21/2014, 10:43 AM  By signing my name below, I, Burnett Harry attest that this documentation has been prepared under the direction and in the presence of [name], MD Electronically signed: Burnett Harry, Scribe.  11/13/2014 10:53am  I personally performed the services  described in this documentation. All medical record entries made by the scribe were at my direction. I have reviewed the chart and agree that the record reflects my personal performance and is accurate and complete. [name], MD

## 2014-11-07 NOTE — Care Management Important Message (Signed)
Important Message  Patient Details  Name: SOFYA MOUSTAFA MRN: [id number] Date of Birth: [birth year]   Medicare Important Message Given:  Yes-third notification given    Malcolm Metro, RN 11/14/2014, 9:28 AM

## 2014-11-07 NOTE — Progress Notes (Signed)
Discharged to [facility], out in stable condition with oxygen at 40% VM via stretcher with [facility], family at side.

## 2014-11-07 NOTE — Progress Notes (Signed)
Nutrition Brief Note  Chart reviewed. Pt family is to meet with Hospice Service. Pt now transitioning to comfort care.  No further nutrition interventions warranted at this time.    Royann Shivers MS,RD,CSG,LDN Office: [phone] Pager: [phone]

## 2014-11-07 NOTE — Progress Notes (Signed)
Coban dressing removed from lower legs. Daughter informed of the recommendation from [facility] nurse to leave dressings off, she is agreeable, dressing to remain off.

## 2014-11-07 NOTE — Progress Notes (Signed)
PROGRESS NOTE  Brandy Webb ZOX:[id number] DOB: [birth year] DOA: 10/31/2014 PCP: Brandy Albee, MD  Summary: 83 yow PMH diastolic CHF NYHA III, atrial fibrillation, valvular heart disease, moderate pulm HTN, CAD presented to ED with SOB and confusion. Admitted for sepsis with hypothermia and hypotension, AKI, acute diastolic CHF, acute encephalopathy. Treated with broad spectrum antibiotics and seen in consultation with cardiology. Temperature normalized but patient remained hypotensive, hypoxic and no source of infection was localized. She received 7 days of IV abx. 1/2 GPC in blood contaminant. Condition worsened with increased oxygen requirement and worsening AKI despite treatment. Management of CHF and valvular disease was limited by hypotension. Despite treatment patient remained hypotensive and hypoxic with poor oral intake. Dr. Kerry Hough discussed with son Brandy Webb 10/5 and patient was referred to hospice, antibiotics were stopped and plans were made to focus on comfort.   Assessment/Plan: 1. Presumed sepsis with hypothermia, hypotension, AKI, acute encephalopathy, lactic acidosis on admission. No source identified. Currently afebrile but remains hypoxic. CXR showed interstitial edema and UC was unremarkable.  BC show 1/2+ for diphtheroids, contaminant.  2. Acute on chronic diastolic heart failure NYHA class III.treatment complicated by valvular disease and hypotension. I/O balanced.  3. Acute respiratory failure with hypoxia likely related to CHF. O2 sat in the low 80s on Venti Mask.  4. AKI, worsening secondary to cardiorenal syndrome, FTT 5. Acute encephalopathy, likely secondary to hypoxia, CT head remote infarct. Son reports stroke symptoms several years ago. No further evaluation suggestion. 6. Chronic atrial fibrillation, on Eliquis on admission.   7. Valvular heart disease including moderate to severe mitral regurgitation and severe tricuspid regurgitation. Following conservatively.    8. Moderate pulmonary hypertension  9. CAD, s/p NSTEMI 06/2011 10. LE venous stasis disease with one small ulcer left upper lateral calf. Silicone foam to the LLE ulcer. WOC placed bilateral 4 layer compression wraps 10/2.Recommend change again on 10/6 11. Severe malnutrition in context of chronic illness 12. Discussion.     Hypoxic, hypotensive and tachycardic but appears stable. Review of record shows hypoxia and hypotension, tachycardia without significant change last 72 hours. Prognosis is days, recommend transfer to residential hospice.   Long discussion with son Brandy Webb, daughter and son-in-law at bedside. Reviewed all testing and reviewed decision for comfort care 10/5. Family confirms comfort care and minimization of meds. Understanding that death may occur at any time but agree with transfer.  Discharge to hospice care.   Code Status: DNR DVT Prophylaxis Lovenox Family discussion: Discussed plan in detail with family at bedside. No further concerns at this time. Disposition Plan: Discharge to hospice care   Brandy Sacks, MD  [facility]  Pager [phone] If 7PM-7AM, please contact night-coverage at [email or web address], password [email or web address] 11/18/2014, 6:16 AM  LOS: 7 days   Consultants:  Cardiology   Procedures:  ECHO Study Conclusions - Left ventricle: The cavity size was normal. Wall thickness was normal. Systolic function was normal. The estimated ejection fraction was in the range of 55% to 60%. Wall motion was normal; there were no regional wall motion abnormalities. The study was not technically sufficient to allow evaluation of LV diastolic dysfunction due to atrial fibrillation. - Aortic valve: Mildly calcified annulus. Trileaflet; mildly thickened leaflets. Valve area (VTI): 2.14 cm^2. Valve area (Vmax): 2.06 cm^2. - Mitral valve: Mildly calcified annulus. Moderately thickened leaflets . The MR jet is eccentric and posterior, this may lead to  underestimation of severity. There was moderate regurgitation. - Left atrium: The atrium was severely dilated. - Right ventricle: The  ventricular septum is flattened in systole and diastole consistent with RV pressure and volume overload. The cavity size was moderately to severely dilated. The RV shares the apex with the LV. Systolic function was mildly reduced. TAPSE: 14 mm . - Right atrium: The atrium was massively dilated. - Tricuspid valve: There was moderate-severe regurgitation. - Pulmonary arteries: Systolic pressure was moderately increased. PA peak pressure: 65 mm Hg (S). - Inferior vena cava: The vessel was dilated. The respirophasic diameter changes were blunted (< 50%), consistent with elevated central venous pressure.  Antibiotics:  Vancomycin 9/29>>10/5  Zosyn 9/29>>10/5  HPI/Subjective: Patient is asleep. Per son, she has eaten small amounts of oatmeal and ice chips. She has denied significant pain throughout the night. Very little confusion. Patient awoke during physical exam but was difficult to understand.  Family has elected comfort care and transfer to residential hospice today.  Objective: Filed Vitals:   11/05/14 1900 11/06/14 0636 11/06/14 1403 11/03/2014 0519  BP: 78/49  Pulse: 90 79 51 104  Temp: 97.5 F (36.4 C) 97.7 F (36.5 C) 97.7 F (36.5 C) 96.8 F (36 C)  TempSrc: Axillary Oral Axillary Axillary  Resp: 30 33 26 24  Height:      Weight:    83.734 kg (184 lb 9.6 oz)  SpO2: 100% 85% 90% 83%    Intake/Output Summary (Last 24 hours) at 11/27/2014 0616 Last data filed at 11/06/14 1743  Gross per 24 hour  Intake    230 ml  Output      0 ml  Net    230 ml     Filed Weights   11/05/14 0400 11/05/14 1505 11/02/2014 0519  Weight: 78.3 kg (172 lb 9.9 oz) 79.334 kg (174 lb 14.4 oz) 83.734 kg (184 lb 9.6 oz)    Exam:   83% SpO2, SBP 78, HR 104 General:  Appears comfortable, calm. On venti mask. Appears ill. Awakens  during exam but speech difficult to understand. Eyes: Eyes closed Cardiovascular: Irregular rhythm, tachy, no murmur, rub or gallop. 1/2+ BLE edema Respiratory: Clear to auscultation bilaterally, no wheezes, rales or rhonchi. Normal respiratory effort. Abdomen: soft, ntnd Musculoskeletal: Both legs are wrapped with compression dressings. Psychiatric: cannot assess, patient alerts but speech difficult to understand Neurologic: grossly non-focal.  New data reviewed:  none  Pertinent data since admission:  BC reveal gram positive diphtheroids.   HEAD CT   IMPRESSION: Small infarct in the right parietal lobe appears remote but its chronicity cannot be definitively determined. Mild atrophy.  CXR   IMPRESSION: Cardiomegaly and interstitial edema with associated small bilateral pleural effusions.  Pending data:    Scheduled Meds: . antiseptic oral rinse  7 mL Mouth Rinse q12n4p  . docusate sodium  100 mg Oral BID  . feeding supplement (ENSURE ENLIVE)  237 mL Oral BID BM  . hydroxypropyl methylcellulose / hypromellose  1 drop Right Eye QID  . levothyroxine  150 mcg Oral QAC breakfast  . multivitamin with minerals  1 tablet Oral Daily   Continuous Infusions: . sodium chloride 10 mL/hr at 11/05/14 0600    Principal Problem:   Acute on chronic diastolic heart failure (HCC) Active Problems:   Chronic atrial fibrillation (HCC)   Acute kidney injury (HCC)   A-fib (HCC)   Hypothermia   Hypotension   Pleural effusion   Acute encephalopathy   Acute on chronic diastolic HF (heart failure) (HCC)   Protein-calorie malnutrition, severe (HCC)     By signing my name  below, I, Burnett Harry attest that this documentation has been prepared under the direction and in the presence of Brandy Sacks, MD Electronically signed: Burnett Harry, Scribe. 11/06/2014 10:53am  I personally performed the services described in this documentation. All medical record entries made by the  scribe were at my direction. I have reviewed the chart and agree that the record reflects my personal performance and is accurate and complete. Brandy Sacks, MD

## 2014-11-07 NOTE — Progress Notes (Signed)
Reported to Hansel Starling, RN at [facility].

## 2014-12-03 DEATH — deceased

## 2014-12-23 ENCOUNTER — Ambulatory Visit: Payer: Medicare Other | Admitting: Cardiology

## 2016-02-23 IMAGING — CT CT HEAD W/O CM
1 of 2 series · 16 of 30 positions shown, 20 images · non-contrast
Comparison: None.

CLINICAL DATA: Patient unresponsive this morning. Initial
encounter.

EXAM:
CT HEAD WITHOUT CONTRAST
TECHNIQUE: Contiguous axial images were obtained from the base of the skull
through the vertex without intravenous contrast.

[Series 2: headseq 4.8 h37s · axial · 0.43mm/px · z∈[+128,+285]mm · 16 of 36 slices shown, 20 images]
[im 2/36  brain]
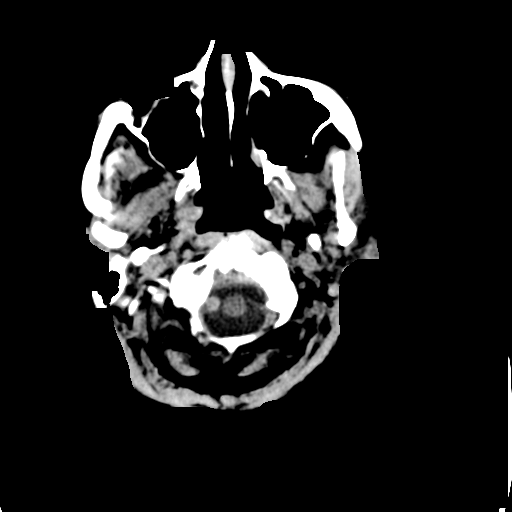
[im 2/36  bone]
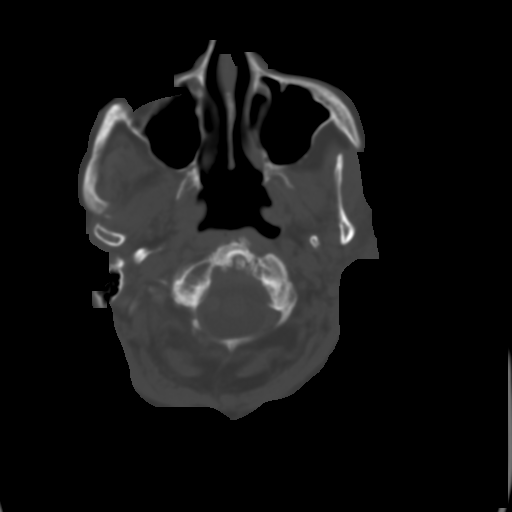
[im 4/36  brain]
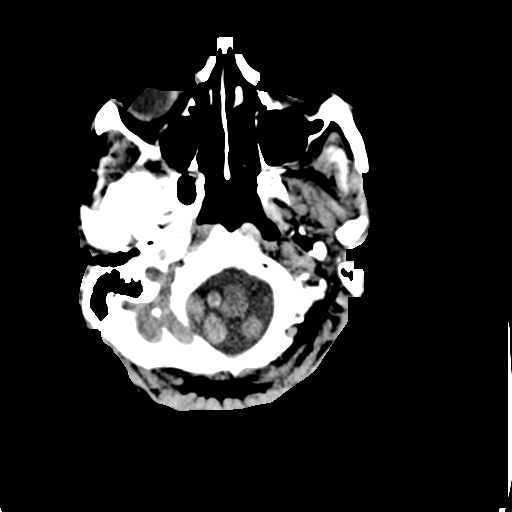
[im 7/36  brain]
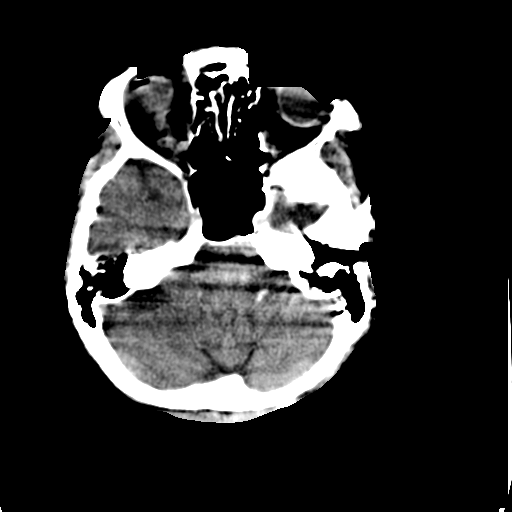
[im 9/36  brain]
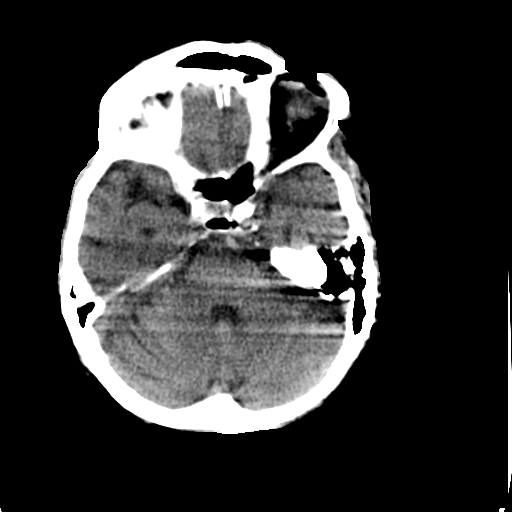
[im 11/36  brain]
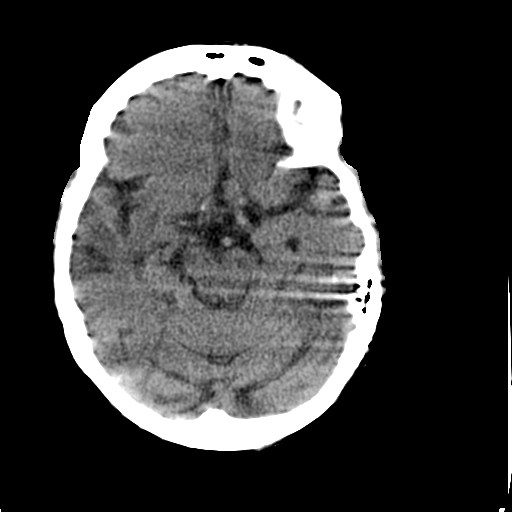
[im 11/36  bone]
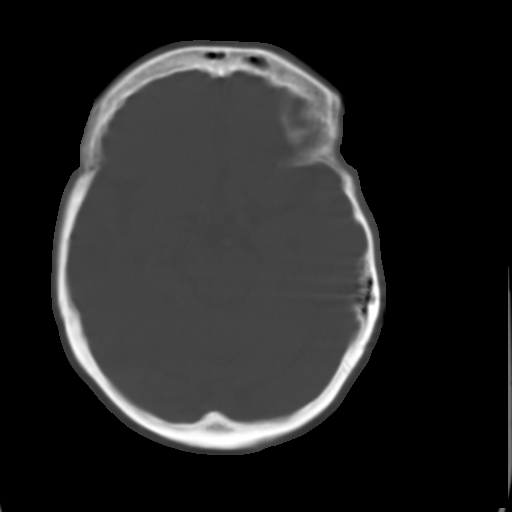
[im 12/36  brain]
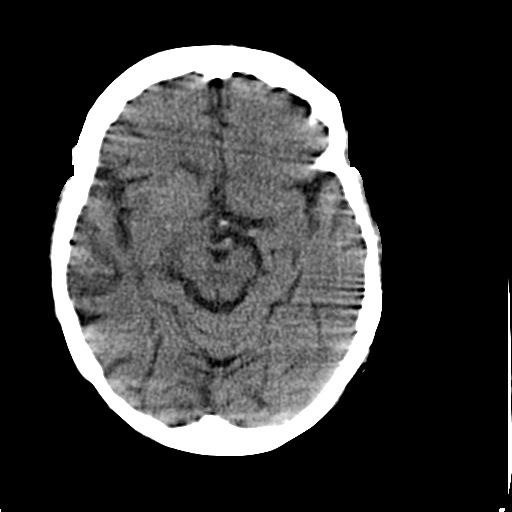
[im 16/36  brain]
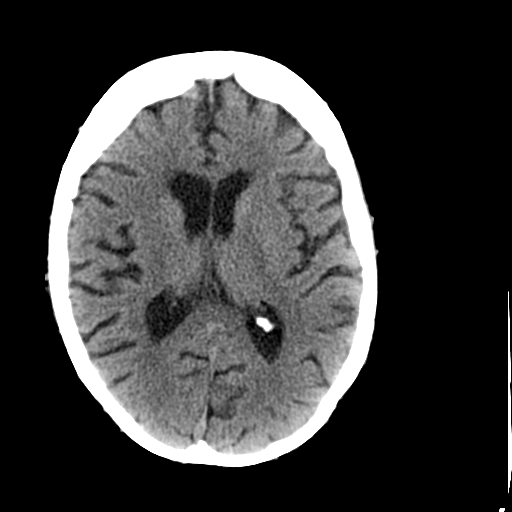
[im 17/36  brain]
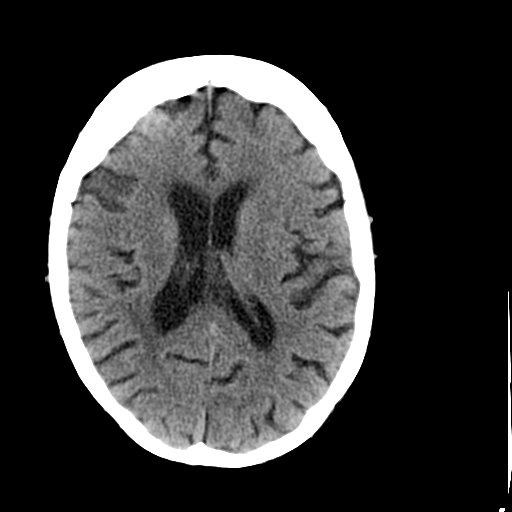
[im 19/36  brain]
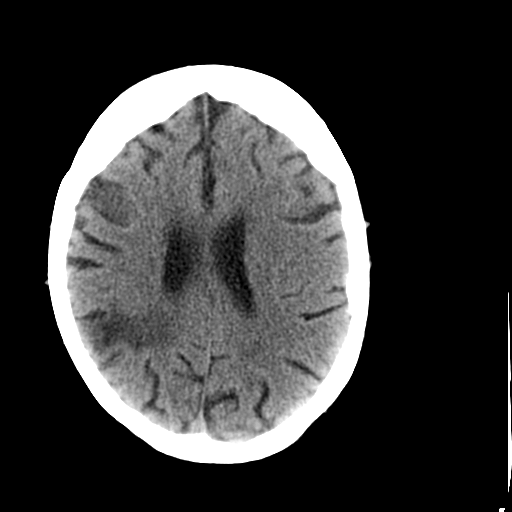
[im 19/36  bone]
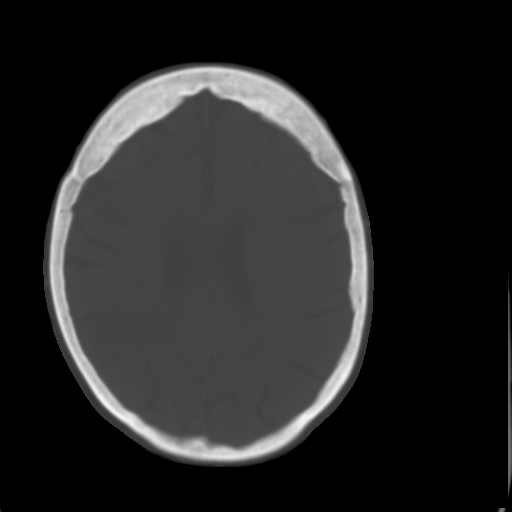
[im 21/36  brain]
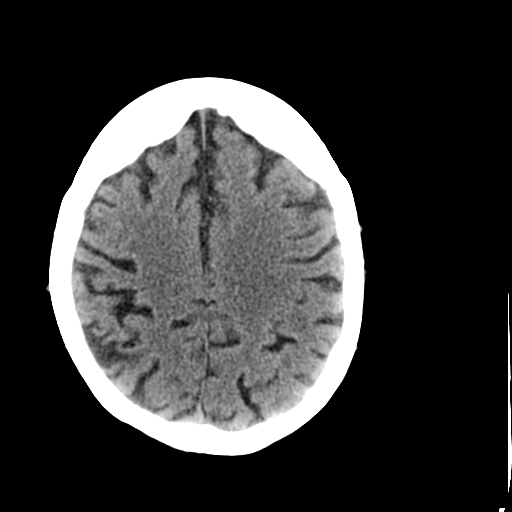
[im 24/36  brain]
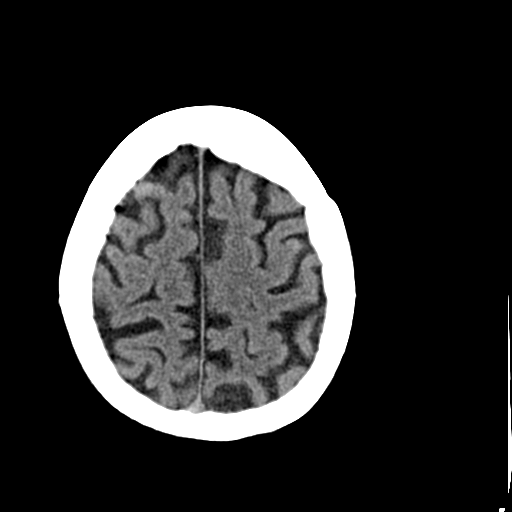
[im 26/36  brain]
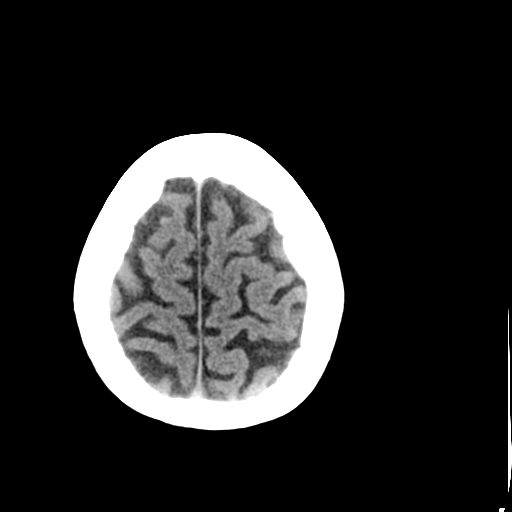
[im 27/36  brain]
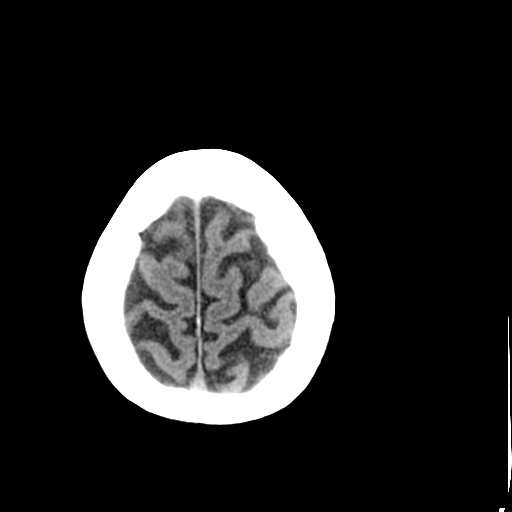
[im 27/36  bone]
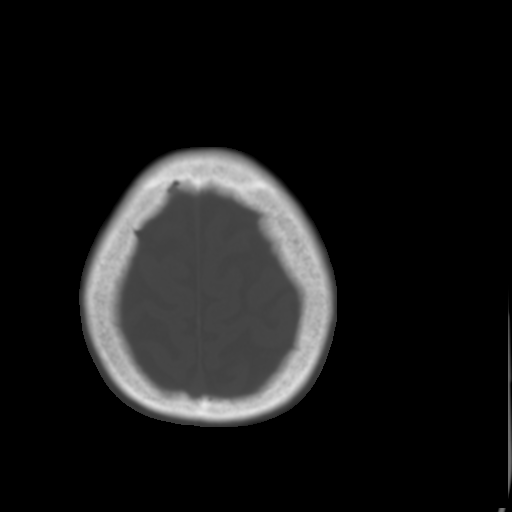
[im 29/36  brain]
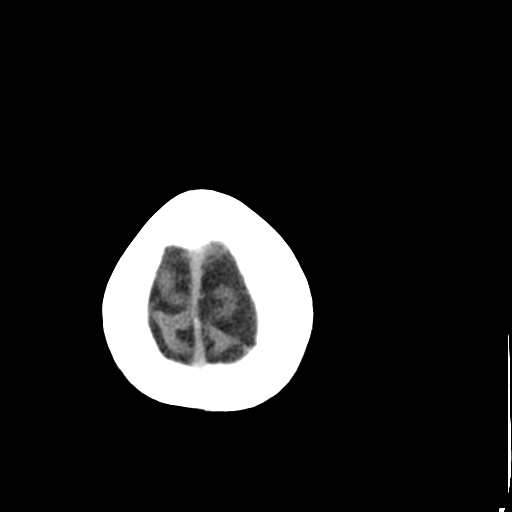
[im 32/36  brain]
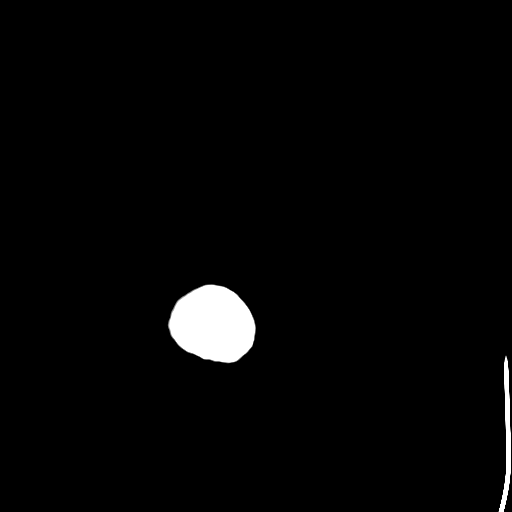
[im 34/36  brain]
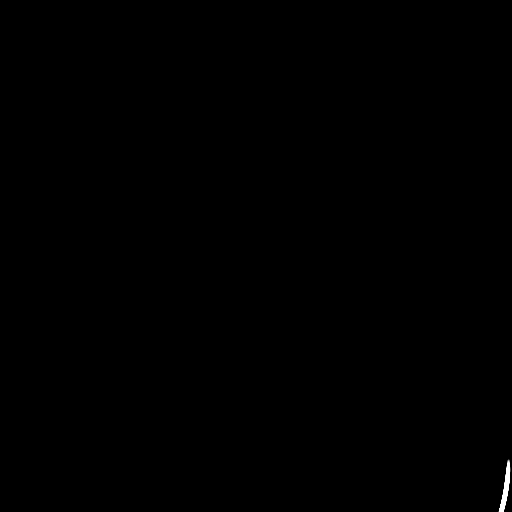

[16 of 30 positions shown; findings below may reference images not displayed]

FINDINGS: The patient has a small infarct in the right parietal lobe. Although
its chronicity cannot be definitively determined, it appears
subacute to remote. There is some cortical atrophy. No hemorrhage,
mass lesion, midline shift or abnormal extra-axial fluid collection.
No hydrocephalus or pneumocephalus. The calvarium is intact. Imaged
paranasal sinuses and mastoid air cells are clear.
IMPRESSION: Small infarct in the right parietal lobe appears remote but its
chronicity cannot be definitively determined.

Mild atrophy.
